# Patient Record
Sex: Male | Born: 1975 | Race: White | Hispanic: No | Marital: Married
Health system: Midwestern US, Academic
[De-identification: ages and names within clinical notes are randomized; demographics above are authoritative.]

---

## 2016-04-16 MED ORDER — EVEROLIMUS (IMMUNOSUPPRESSIVE) 0.5 MG PO TAB
2 mg | ORAL_TABLET | Freq: Two times a day (BID) | ORAL | 5 refills | 30.00000 days | Status: DC
Start: 2016-04-16 — End: 2017-02-06

## 2016-06-01 MED ORDER — PRAVASTATIN 10 MG PO TAB
10 mg | ORAL_TABLET | Freq: Every evening | ORAL | 1 refills | 90.00000 days | Status: DC
Start: 2016-06-01 — End: 2016-06-04

## 2016-06-01 MED ORDER — METOPROLOL TARTRATE 25 MG PO TAB
12.5 mg | ORAL_TABLET | Freq: Two times a day (BID) | ORAL | 1 refills | 90.00000 days | Status: DC
Start: 2016-06-01 — End: 2016-06-04

## 2016-06-04 MED ORDER — METOPROLOL TARTRATE 25 MG PO TAB
12.5 mg | ORAL_TABLET | Freq: Two times a day (BID) | ORAL | 1 refills | 90.00000 days | Status: DC
Start: 2016-06-04 — End: 2016-12-04

## 2016-06-04 MED ORDER — PRAVASTATIN 10 MG PO TAB
10 mg | ORAL_TABLET | Freq: Every evening | ORAL | 1 refills | 90.00000 days | Status: DC
Start: 2016-06-04 — End: 2016-12-04

## 2016-06-21 ENCOUNTER — Encounter: Admit: 2016-06-21 | Discharge: 2016-06-21 | Payer: MEDICARE

## 2016-06-21 ENCOUNTER — Ambulatory Visit: Admit: 2016-07-24 | Discharge: 2016-07-24 | Payer: MEDICARE

## 2016-06-21 DIAGNOSIS — C44622 Squamous cell carcinoma of skin of right upper limb, including shoulder: Principal | ICD-10-CM

## 2016-06-25 ENCOUNTER — Encounter: Admit: 2016-06-25 | Discharge: 2016-06-25 | Payer: MEDICARE

## 2016-06-25 DIAGNOSIS — K769 Liver disease, unspecified: ICD-10-CM

## 2016-06-25 DIAGNOSIS — C801 Malignant (primary) neoplasm, unspecified: ICD-10-CM

## 2016-06-25 DIAGNOSIS — K7460 Unspecified cirrhosis of liver: ICD-10-CM

## 2016-06-25 DIAGNOSIS — IMO0002 Squamous cell carcinoma: ICD-10-CM

## 2016-06-25 DIAGNOSIS — Z944 Liver transplant status: ICD-10-CM

## 2016-06-25 DIAGNOSIS — N200 Calculus of kidney: ICD-10-CM

## 2016-06-25 DIAGNOSIS — K754 Autoimmune hepatitis: Principal | ICD-10-CM

## 2016-06-25 DIAGNOSIS — A0472 Enterocolitis due to Clostridium difficile, not specified as recurrent: ICD-10-CM

## 2016-06-25 DIAGNOSIS — T148XXA Other injury of unspecified body region, initial encounter: ICD-10-CM

## 2016-06-25 DIAGNOSIS — I8500 Esophageal varices without bleeding: ICD-10-CM

## 2016-06-25 NOTE — Progress Notes
Name: Ricky Rhodes          MRN: [id number]      DOB: 09/03/1975      AGE: 41 y.o.   DATE OF SERVICE: 06/21/2016    Subjective:             Reason for Visit:  Heme/Onc Care      Ricky Rhodes is a 41 y.o. male.     Cancer Staging  No matching staging information was found for the patient.    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       Review of Systems   Constitutional: Negative.          Objective:         ??? aspirin EC 81 mg tablet Take 81 mg by mouth daily. Take with food.   ??? calcium carbonate-vitamin D3 600 mg(1,500mg ) -800 unit tab Take 1 Tab by mouth twice daily.   ??? Cholecalciferol (Vitamin D3) 2,000 unit cap Take 2,000 Units by mouth twice daily.   ??? everolimus (immunosuppressive) (ZORTRESS) 0.5 mg tablet Take 4 tablets by mouth twice daily.   ??? fluorouracil (EFUDEX) 5 % topical cream    ??? metoprolol tartrate (LOPRESSOR) 25 mg tablet Take 0.5 tablets by mouth twice daily.   ??? omeprazole DR(+) (PRILOSEC) 20 mg capsule Take 1 capsule by mouth twice daily.   ??? oxyCODONE (ROXICODONE, OXY-IR) 5 mg tablet Take 1-2 tablets by mouth every 4 hours as needed for Pain Earliest Fill Date: 02/28/16   ??? pravastatin (PRAVACHOL) 10 mg tablet Take 1 tablet by mouth at bedtime daily.   ??? prednisone (DELTASONE) 5 mg tablet Take 5 mg by mouth daily with breakfast.   ??? senna/docusate (SENOKOT-S) 8.6/50 mg tablet Take 1 tablet by mouth daily.   ??? tacrolimus (PROGRAF) 1 mg capsule Take 1 mg by mouth twice daily.     Vitals:    06/21/16 0853   BP: (!) 134/94   Pulse: 58   Resp: 12   Temp: 36.3 ???C (97.4 ???F)   TempSrc: Oral   SpO2: 99%   Weight: 90.5 kg (199 lb 9.6 oz)     Body mass index is 33.22 kg/m???.     Pain Score: Zero         Pain Addressed:  N/A    Patient Evaluated for a Clinical Trial: No treatment clinical trial available for this patient.     [place] Cooperative Oncology Group performance status is 0, Fully active, able to carry on all pre-disease performance without restriction.Marland Kitchen     Physical Exam   Constitutional: He appears well-developed and well-nourished.   Cardiovascular: Normal rate and regular rhythm.    No murmur heard.  Pulmonary/Chest: Effort normal and breath sounds normal. No respiratory distress.   Skin:   Biopsy site on base of the right thumb and then more medially on dorsal aspect of hand   Psychiatric: He has a normal mood and affect. His behavior is normal. Judgment and thought content normal.             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  Risks, benefits,  and alternatives were discussed with the patient and the patient is eager to proceed.

## 2016-07-03 ENCOUNTER — Encounter: Admit: 2016-07-03 | Discharge: 2016-07-03 | Payer: MEDICARE

## 2016-07-03 DIAGNOSIS — N200 Calculus of kidney: ICD-10-CM

## 2016-07-03 DIAGNOSIS — IMO0002 Squamous cell carcinoma: ICD-10-CM

## 2016-07-03 DIAGNOSIS — C801 Malignant (primary) neoplasm, unspecified: ICD-10-CM

## 2016-07-03 DIAGNOSIS — T148XXA Other injury of unspecified body region, initial encounter: ICD-10-CM

## 2016-07-03 DIAGNOSIS — I8500 Esophageal varices without bleeding: ICD-10-CM

## 2016-07-03 DIAGNOSIS — A0472 Enterocolitis due to Clostridium difficile, not specified as recurrent: ICD-10-CM

## 2016-07-03 DIAGNOSIS — K769 Liver disease, unspecified: ICD-10-CM

## 2016-07-03 DIAGNOSIS — K754 Autoimmune hepatitis: Principal | ICD-10-CM

## 2016-07-03 DIAGNOSIS — K7460 Unspecified cirrhosis of liver: ICD-10-CM

## 2016-07-03 DIAGNOSIS — Z944 Liver transplant status: ICD-10-CM

## 2016-07-03 NOTE — Pre-Anesthesia Patient Instructions
GENERAL INFORMATION    Before you come to the hospital  ??? Make arrangements for a responsible adult to drive you home and stay with you for 24 hours following surgery.  ??? Bath/Shower Instructions  ??? Take a bath or shower with antibacterial soap the night before or the morning of your procedure. Use clean towels.  ??? Put on clean clothes after bath or shower.  Avoid using lotion and oils.  ??? If you are having surgery above the waist, wear a shirt that fastens up the front.  ??? Sleep on clean sheets if bath or shower is done the night before procedure.  ??? Leave money, credit cards, jewelry, and any other valuables at home. [facility] is not responsible for the loss or breakage of personal items.  ??? Remove nail polish, makeup and all jewelry (including piercings) before coming to the hospital.  ??? The morning of your procedure:  ??? brush your teeth and tongue  ??? do not smoke  ??? do not shave the area where you will have surgery    What to bring to the hospital  ??? ID/ Insurance Card  ??? Medical Device card  ??? Official documents for legal guardianship   ??? Copy of your Living Will, Advanced Directives, and/or Durable Power of Attorney   ??? Small bag with a few personal belongings  ??? Walker,cane, or motorized scooter  ??? Cases for glasses/hearing aids/contact lens (bring solutions for contacts)  ??? Dress in clean, loose, comfortable clothing     Eating or drinking before surgery  ??? Do not eat or drink anything after 11:00 p.m. the day before your procedure (including gum, mints, candy, or chewing tobacco) OR follow the specific instructions you were given by your Surgeon.  ??? You may have WATER ONLY up to 2 hours before arriving at the hospital.  ??? Other instructions:      Other instructions  Notify your surgeon if:    ??? you become ill with a cough, fever, sore throat, nausea, vomiting or flu-like symptoms  ??? you have any open wounds/sores that are red, painful, draining, or are new since you last saw  the doctor  ??? you need to cancel your procedure  ??? You will receive a call with your surgery arrival time from between 2:30pm and 4:30pm the last business day before your procedure.  If you do not receive a call, please call [phone] before 4:30pm or [phone] after 4:30pm.    Notify us at [facility]: [phone]  ??? if you need to cancel your procedure  ??? if you are going to be late    Arrival at the hospital  [facility]  [address]  [place], [place] [address]    ? Park in the P5 parking garage located at [employer], [place], [place] [address].   ? Judee Clara parking is available in front of [employer] [facility] between the hours of 7:00 am and 4:00 pm Monday through Friday.  ? If parking in the P5 garage, take the east elevators in the parking garage to the second level and walk to the entrance of the [employer] [facility].    ? Enter through the 1st floor main entrance and check in with Information Desk.

## 2016-07-03 NOTE — Pre-Anesthesia Medication Instructions
YOUR MEDICATIONS:     aspirin EC 81 mg tablet Take 81 mg by mouth daily. Take with food.    calcium carbonate-vitamin D3 600 mg(1,500mg ) -800 unit tab Take 1 Tab by mouth twice daily.    Cholecalciferol (Vitamin D3) 2,000 unit cap Take 2,000 Units by mouth twice daily.    everolimus (immunosuppressive) (ZORTRESS) 0.5 mg tablet Take 4 tablets by mouth twice daily.    metoprolol tartrate (LOPRESSOR) 25 mg tablet Take 0.5 tablets by mouth twice daily.    omeprazole DR(+) (PRILOSEC) 20 mg capsule Take 1 capsule by mouth twice daily.    pravastatin (PRAVACHOL) 10 mg tablet Take 1 tablet by mouth at bedtime daily.    prednisone (DELTASONE) 5 mg tablet Take 5 mg by mouth daily with breakfast.    tacrolimus (PROGRAF) 1 mg capsule Take 1 mg by mouth twice daily.          YOUR  MEDICATION INSTRUCTIONS FOR SURGERY:    Before surgery  Stop the following vitamins, herbals, and natural supplements 14 days before surgery:      Stop the following medications 7 days before surgery:   Anti-inflammatory medications such as ibuprofen (Advil, Motrin) and naproxen (Aleve)   You may use acetaminophen (Tylenol)     Please follow these instructions regarding your blood thinner medications:     Stop Aspirin 7 days prior to your procedure      Morning of surgery  On the morning of surgery, do NOT take these medications:   Remaining vitamins/supplements   Ointments/creams/lotions   Vitamin D3    On the morning of surgery, take ONLY these medications with a sip (1-2 ounces) of water:   Prednisone   Metoprolol   Omeprazole   Tacrolimus      Other information  Before surgery, please contact Jonelle Sidle, with any medicine updates or questions.   E-mail:  [email or web address]   Phone:  [phone]    Before going home from the hospital, please ask your doctor when you should re-start your medicines that were stopped before surgery.

## 2016-07-03 NOTE — Progress Notes
[facility] Phone Triage Note:    [facility] Inpatient phone triage completed for procedure on 07/25/16. Patient denies cardiac, pulmonary or neurologic history. He denies chest pain, palpitations or SOA.  He states he could easily climb 2 flights of steps and remain asymptomatic. 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 The patient was advised to stop vitamins herbals and supplements 14 days prior to his procedure and avoid NSAIDS 7 days prior. He was instructed to take only approved medications on the DOS: Tacrolimus, Metoprolol, Prednisone and Omeprazole with sips of water only and may have continued sips of water until 2 hours prior to check in at Admissions. Pre op instructions completed including NPO after 11 PM. Patient verbalized understanding of all instructions and will receive a copy via mail with a map. No further questions at this time.

## 2016-07-10 ENCOUNTER — Encounter: Admit: 2016-07-10 | Discharge: 2016-07-10 | Payer: MEDICARE

## 2016-07-10 ENCOUNTER — Ambulatory Visit: Admit: 2016-07-10 | Discharge: 2016-07-10 | Payer: MEDICARE

## 2016-07-10 DIAGNOSIS — K7469 Other cirrhosis of liver: Principal | ICD-10-CM

## 2016-07-10 DIAGNOSIS — Z79899 Other long term (current) drug therapy: ICD-10-CM

## 2016-07-10 DIAGNOSIS — Z944 Liver transplant status: Secondary | ICD-10-CM

## 2016-07-10 DIAGNOSIS — A0472 Enterocolitis due to Clostridium difficile, not specified as recurrent: ICD-10-CM

## 2016-07-10 DIAGNOSIS — R1319 Other dysphagia: ICD-10-CM

## 2016-07-10 DIAGNOSIS — IMO0002 Squamous cell carcinoma: ICD-10-CM

## 2016-07-10 DIAGNOSIS — M899 Disorder of bone, unspecified: ICD-10-CM

## 2016-07-10 DIAGNOSIS — M8580 Other specified disorders of bone density and structure, unspecified site: ICD-10-CM

## 2016-07-10 DIAGNOSIS — K7460 Unspecified cirrhosis of liver: ICD-10-CM

## 2016-07-10 DIAGNOSIS — C801 Malignant (primary) neoplasm, unspecified: ICD-10-CM

## 2016-07-10 DIAGNOSIS — I8500 Esophageal varices without bleeding: ICD-10-CM

## 2016-07-10 DIAGNOSIS — N200 Calculus of kidney: ICD-10-CM

## 2016-07-10 DIAGNOSIS — Z4889 Encounter for other specified surgical aftercare: ICD-10-CM

## 2016-07-10 DIAGNOSIS — K754 Autoimmune hepatitis: Principal | ICD-10-CM

## 2016-07-10 DIAGNOSIS — K769 Liver disease, unspecified: ICD-10-CM

## 2016-07-10 DIAGNOSIS — T148XXA Other injury of unspecified body region, initial encounter: ICD-10-CM

## 2016-07-10 NOTE — Progress Notes
Date of Service: 07/10/2016    Ricky Rhodes is a 41 y.o. male.    Subjective:             Chief Complaint/Purpose of Visit: routine follow up, status post OLT     History of Present Illness     Ricky Rhodes is a pleasant 41 year old male who is followed in our clinic for post transplant management.  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 He did have mild rejection shortly after surgery but no issues with graft function or rejection since.     Ricky Rhodes has however had issues with recurrent invasive squamous cell skin cancers.  He follows with his dermatologist regularly. Once these cancers started developing, Dr. Levada Dy transitioned Mr. Remo' immunosuppression to an Everolimus based regimen. Overall, the development and severity of new lesions decreased.  Aside from this, he has experienced a significant improvement in his overall health since transplant.  He denies any physical complaints today and feels well.      [name] did undergo orthopedic surgery last year for a tibial fracture.  Everolimus was held during this time, as it can affect wound healing, and Tacrolimus used as primary IS agent.  Mr. Hackbart recovered from surgery without issue and has since re-started Everolimus.   ???  Ricky Rhodes is accompanied today by his wife, Jolene.  She is very knowledgeable regarding his medication regimen and plan of care. They live together in a town about 2 hours away with their three children.     Ricky Rhodes endorses no other worrisome health concerns at today's visit.       Review of Systems: A 10 point review systems is performed and with the exceptions noted in the HPI are otherwise unremarkable.     Objective:         ??? aspirin EC 81 mg tablet Take 81 mg by mouth daily. Take with food.   ??? calcium carbonate-vitamin D3 600 mg(1,500mg ) -800 unit tab Take 1 Tab by mouth twice daily.   ??? Cholecalciferol (Vitamin D3) 2,000 unit cap Take 2,000 Units by mouth twice daily. ??? everolimus (immunosuppressive) (ZORTRESS) 0.5 mg tablet Take 4 tablets by mouth twice daily.   ??? metoprolol tartrate (LOPRESSOR) 25 mg tablet Take 0.5 tablets by mouth twice daily.   ??? omeprazole DR(+) (PRILOSEC) 20 mg capsule Take 1 capsule by mouth twice daily.   ??? pravastatin (PRAVACHOL) 10 mg tablet Take 1 tablet by mouth at bedtime daily.   ??? prednisone (DELTASONE) 5 mg tablet Take 5 mg by mouth daily with breakfast.   ??? tacrolimus (PROGRAF) 1 mg capsule Take 1 mg by mouth twice daily.     Vitals:    07/10/16 1005   BP: (!) 132/98   Pulse: 51   Resp: 16   Temp: 36.7 ???C (98 ???F)   TempSrc: Oral   SpO2: 98%   Weight: 88.9 kg (196 lb)   Height: 167.6 cm (66)     Body mass index is 31.64 kg/m???.    Physical Exam   Constitutional: He appears well-developed and well-nourished. No distress.   Eyes: No scleral icterus.   Cardiovascular: Normal rate, regular rhythm and normal heart sounds.    No murmur heard.  Pulmonary/Chest: Effort normal and breath sounds normal.   Abdominal: Soft. He exhibits no distension. There is no tenderness.   Musculoskeletal: Normal range of motion. He exhibits no edema.   Neurological: He  is alert and oriented to person, place, and time.   No tremor   Skin: Skin is warm and dry. Multiple dry flaky lesions noted on bilateral upper extremities.        Assessment and Plan:  1. 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  [name] did have labs completed last week, and we will request these results for review. There have been no recent issues with rejection. Continue with labs on an every 1-2 month basis.   [name] had an ultrasound performed prior to today's visit that demonstrated no worrisome findings.   ???  2. Chronic immunosuppression management: I will follow up on drug trough levels from labs obtained last week. Overall, patient is tolerating current IS regimen without issue. I recommend no changes today.   Everolimus: Trough goal 5-8, current dose 2 mg BID  Tacrolimus: Trough goal 2-3, current dose 1 mg BID  Prednisone: 5 mg daily  ???  3. Healthcare Maintenance:   [name] is at increased risk for hyperlipidemia with use of Everolimus.  Lipid panel from 02/2016 reviewed with patient and is much improved from previous testing. Continue Pravastain 10 mg qhs. I do recommend that we re-check a lipid panel later this year.     DEXA scan last performed 02/2014 and was suggestive of osteopenia. Serum Vitamin D was 57 in February. I do recommend that we obtain a repeat DEXA this year--- will have orders faxed to [facility].           At the end of the visit, Ricky Rhodes had all of his questions answered. He has our contact information and is encouraged to call with any new questions or concerns.  I would like to see the patient back for follow up in 1 year with Ricky Rhodes with an ultrasound on the same day.

## 2016-07-12 ENCOUNTER — Encounter: Admit: 2016-07-12 | Discharge: 2016-07-12 | Payer: MEDICARE

## 2016-07-12 DIAGNOSIS — Z944 Liver transplant status: Principal | ICD-10-CM

## 2016-07-12 DIAGNOSIS — Z79899 Other long term (current) drug therapy: ICD-10-CM

## 2016-07-12 NOTE — Telephone Encounter
[facility] lab to discuss pt's standing order. On 07/05/16, pt's lab draw included sirolimus level and not everolimus level.  Per lab, pt's standing order is for tacrolimus and everolimus and sirolimus was processed in error.  NC asked lab to discard all standing orders for pt and will fax new order to avoid confusion.  NC will also fax one-time order for tacrolimus and everolimus levels to be drawn within the next week.  Pt notified via [place].

## 2016-07-17 ENCOUNTER — Encounter: Admit: 2016-07-17 | Discharge: 2016-07-17 | Payer: MEDICARE

## 2016-07-17 DIAGNOSIS — Z79899 Other long term (current) drug therapy: ICD-10-CM

## 2016-07-17 DIAGNOSIS — Z944 Liver transplant status: Principal | ICD-10-CM

## 2016-07-17 LAB — COMPREHENSIVE METABOLIC PANEL
Lab: 0.6 mg/dL — ABNORMAL HIGH (ref 0.20–1.30)
Lab: 1 mg/dL — ABNORMAL HIGH (ref 0.7–1.2)
Lab: 144 mmol/l — ABNORMAL LOW (ref 134.0–144.0)
Lab: 17 mg/dL (ref >60)
Lab: 40 U/L (ref >60)
Lab: 42 U/L — ABNORMAL HIGH (ref 21–72)
Lab: 72 mg/dL — ABNORMAL LOW (ref 74–106)
Lab: 89 U/L — ABNORMAL HIGH (ref 34–104)
Lab: >60 mL/min (ref 14.0–18.0)

## 2016-07-17 LAB — CBC AND DIFF
Lab: 0.1 10*3/uL (ref 0.0–0.1)
Lab: 0.3 10*3/uL — ABNORMAL HIGH (ref 0.0–0.2)
Lab: 1 10*3/uL — ABNORMAL HIGH (ref 0.3–0.8)
Lab: 1.9 10*3/uL (ref 1.3–2.9)
Lab: 3.8 10*3/uL (ref 2.2–4.8)

## 2016-07-20 ENCOUNTER — Encounter: Admit: 2016-07-20 | Discharge: 2016-07-20 | Payer: MEDICARE

## 2016-07-23 ENCOUNTER — Encounter: Admit: 2016-07-23 | Discharge: 2016-07-23 | Payer: MEDICARE

## 2016-07-23 DIAGNOSIS — Z79899 Other long term (current) drug therapy: ICD-10-CM

## 2016-07-23 DIAGNOSIS — Z944 Liver transplant status: Principal | ICD-10-CM

## 2016-07-23 LAB — EVEROLIMUS BLOOD

## 2016-07-23 LAB — GGTP: Lab: 33 U/L (ref 3–95)

## 2016-07-23 LAB — TACROLIMUS IMMUNOASSAY (FK506): Lab: 2 ug/L — ABNORMAL LOW (ref 5.0–20.0)

## 2016-07-24 DIAGNOSIS — L570 Actinic keratosis: Principal | ICD-10-CM

## 2016-07-25 ENCOUNTER — Encounter: Admit: 2016-07-25 | Discharge: 2016-07-25 | Payer: MEDICARE

## 2016-08-03 ENCOUNTER — Encounter: Admit: 2016-08-03 | Discharge: 2016-08-03 | Payer: MEDICARE

## 2016-08-08 ENCOUNTER — Encounter: Admit: 2016-08-08 | Discharge: 2016-08-08 | Payer: MEDICARE

## 2016-08-10 ENCOUNTER — Encounter: Admit: 2016-08-10 | Discharge: 2016-08-10 | Payer: MEDICARE

## 2016-08-10 NOTE — Telephone Encounter
Called pt to request repeat labs.  No answer, left voicemail.

## 2016-08-17 ENCOUNTER — Encounter: Admit: 2016-08-17 | Discharge: 2016-08-17 | Payer: MEDICARE

## 2016-08-20 ENCOUNTER — Encounter: Admit: 2016-08-20 | Discharge: 2016-08-20 | Payer: MEDICARE

## 2016-08-20 DIAGNOSIS — Z79899 Other long term (current) drug therapy: ICD-10-CM

## 2016-08-20 DIAGNOSIS — Z944 Liver transplant status: Principal | ICD-10-CM

## 2016-08-20 LAB — TACROLIMUS IMMUNOASSAY (FK506): Lab: 1.9 ug/L — ABNORMAL LOW (ref 5.0–20.0)

## 2016-08-20 LAB — EVEROLIMUS BLOOD: Lab: 6.7 ng/mL

## 2016-08-20 LAB — GGTP: Lab: 44 U/L (ref 3–95)

## 2016-08-23 ENCOUNTER — Encounter: Admit: 2016-08-23 | Discharge: 2016-08-23 | Payer: MEDICARE

## 2016-08-23 DIAGNOSIS — Z79899 Other long term (current) drug therapy: ICD-10-CM

## 2016-08-23 DIAGNOSIS — Z944 Liver transplant status: Principal | ICD-10-CM

## 2016-08-23 LAB — COMPREHENSIVE METABOLIC PANEL
Lab: 0.4 mg/dL (ref 0.20–1.30)
Lab: 1 mg/dL — ABNORMAL LOW (ref 0.7–1.2)
Lab: 106 mmol/l — ABNORMAL HIGH (ref 98.0–107.0)
Lab: 145 mmol/l — ABNORMAL HIGH (ref 134.0–144.0)
Lab: 18 mg/dL (ref 9–20)
Lab: 26 mmol/l — ABNORMAL HIGH (ref 22–30)
Lab: 3.9 mmol/L (ref 3.5–5.1)
Lab: 35 U/L — ABNORMAL HIGH (ref 17–59)
Lab: 4 g/dL — ABNORMAL LOW (ref >=60)
Lab: 45 U/L (ref 21–72)
Lab: 6 mL/min — ABNORMAL LOW (ref 90.0–130.0)
Lab: 7.3 g/dL — ABNORMAL HIGH (ref 6.3–8.2)
Lab: 77 U/L — ABNORMAL LOW (ref 34–104)
Lab: 84 mg/dL — ABNORMAL LOW (ref 74–106)
Lab: 9.2 mg/dL — ABNORMAL LOW (ref >=60)

## 2016-08-23 LAB — CBC AND DIFF
Lab: 0.1 10*3/uL — ABNORMAL LOW (ref 0–0.1)
Lab: 0.3 10*3/uL — ABNORMAL HIGH (ref 0–0.2)
Lab: 1.9 10*3/uL (ref 1.3–2.9)
Lab: 8.1 10*3/uL (ref 4.8–10.8)

## 2016-08-27 ENCOUNTER — Encounter: Admit: 2016-08-27 | Discharge: 2016-08-27 | Payer: MEDICARE

## 2016-09-03 ENCOUNTER — Encounter: Admit: 2016-09-03 | Discharge: 2016-09-03 | Payer: MEDICARE

## 2016-09-03 ENCOUNTER — Ambulatory Visit: Admit: 2016-07-25 | Discharge: 2016-07-25 | Payer: MEDICARE

## 2016-09-03 ENCOUNTER — Ambulatory Visit: Admit: 2016-09-03 | Discharge: 2016-09-03 | Payer: MEDICARE

## 2016-09-03 DIAGNOSIS — Z85820 Personal history of malignant melanoma of skin: ICD-10-CM

## 2016-09-03 DIAGNOSIS — IMO0002 Squamous cell carcinoma: ICD-10-CM

## 2016-09-03 DIAGNOSIS — K769 Liver disease, unspecified: ICD-10-CM

## 2016-09-03 DIAGNOSIS — Z7982 Long term (current) use of aspirin: ICD-10-CM

## 2016-09-03 DIAGNOSIS — Z944 Liver transplant status: ICD-10-CM

## 2016-09-03 DIAGNOSIS — C801 Malignant (primary) neoplasm, unspecified: ICD-10-CM

## 2016-09-03 DIAGNOSIS — L570 Actinic keratosis: Principal | ICD-10-CM

## 2016-09-03 DIAGNOSIS — I8500 Esophageal varices without bleeding: ICD-10-CM

## 2016-09-03 DIAGNOSIS — T148XXA Other injury of unspecified body region, initial encounter: ICD-10-CM

## 2016-09-03 DIAGNOSIS — K7460 Unspecified cirrhosis of liver: ICD-10-CM

## 2016-09-03 DIAGNOSIS — Z85828 Personal history of other malignant neoplasm of skin: ICD-10-CM

## 2016-09-03 DIAGNOSIS — L905 Scar conditions and fibrosis of skin: ICD-10-CM

## 2016-09-03 DIAGNOSIS — A0472 Enterocolitis due to Clostridium difficile, not specified as recurrent: ICD-10-CM

## 2016-09-03 DIAGNOSIS — K754 Autoimmune hepatitis: Principal | ICD-10-CM

## 2016-09-03 DIAGNOSIS — N200 Calculus of kidney: ICD-10-CM

## 2016-09-03 MED ORDER — PROMETHAZINE 25 MG/ML IJ SOLN
6.25 mg | INTRAVENOUS | 0 refills | Status: DC | PRN
Start: 2016-09-03 — End: 2016-09-03

## 2016-09-03 MED ORDER — LACTATED RINGERS IV SOLP
INTRAVENOUS | 0 refills | Status: DC
Start: 2016-09-03 — End: 2016-09-03
  Administered 2016-09-03: 12:00:00 1000.000 mL via INTRAVENOUS

## 2016-09-03 MED ORDER — LACTATED RINGERS IV SOLP
INTRAVENOUS | 0 refills | Status: DC
Start: 2016-09-03 — End: 2016-09-03

## 2016-09-03 MED ORDER — PREDNISONE 5 MG PO TAB
5 mg | ORAL_TABLET | Freq: Every day | ORAL | 1 refills | Status: AC
Start: 2016-09-03 — End: 2016-12-04

## 2016-09-03 MED ORDER — FENTANYL CITRATE (PF) 50 MCG/ML IJ SOLN
0 refills | Status: DC
Start: 2016-09-03 — End: 2016-09-03
  Administered 2016-09-03: 14:00:00 25 ug via INTRAVENOUS
  Administered 2016-09-03: 13:00:00 50 ug via INTRAVENOUS
  Administered 2016-09-03: 13:00:00 25 ug via INTRAVENOUS

## 2016-09-03 MED ORDER — OXYCODONE 5 MG PO TAB
5 mg | ORAL_TABLET | ORAL | 0 refills | 6.00000 days | Status: AC | PRN
Start: 2016-09-03 — End: 2016-12-19

## 2016-09-03 MED ORDER — BACITRACIN ZINC 500 UNIT/GRAM TP OINT
0 refills | Status: DC
Start: 2016-09-03 — End: 2016-09-03
  Administered 2016-09-03: 14:00:00 1 via TOPICAL

## 2016-09-03 MED ORDER — DEXAMETHASONE SODIUM PHOSPHATE 4 MG/ML IJ SOLN
INTRAVENOUS | 0 refills | Status: DC
Start: 2016-09-03 — End: 2016-09-03
  Administered 2016-09-03: 13:00:00 4 mg via INTRAVENOUS

## 2016-09-03 MED ORDER — CEFAZOLIN 1 GRAM IJ SOLR
0 refills | Status: DC
Start: 2016-09-03 — End: 2016-09-03
  Administered 2016-09-03: 13:00:00 2 g via INTRAVENOUS

## 2016-09-03 MED ORDER — LIDOCAINE (PF) 200 MG/10 ML (2 %) IJ SYRG
0 refills | Status: DC
Start: 2016-09-03 — End: 2016-09-03
  Administered 2016-09-03: 13:00:00 80 mg via INTRAVENOUS

## 2016-09-03 MED ORDER — LIDOCAINE (PF) 10 MG/ML (1 %) IJ SOLN
.1-2 mL | INTRAMUSCULAR | 0 refills | Status: DC | PRN
Start: 2016-09-03 — End: 2016-09-03

## 2016-09-03 MED ORDER — ACETAMINOPHEN 650 MG PO TBER
650 mg | ORAL_TABLET | ORAL | 1 refills | Status: AC | PRN
Start: 2016-09-03 — End: ?

## 2016-09-03 MED ORDER — DEXTRAN 70-HYPROMELLOSE (PF) 0.1-0.3 % OP DPET
0 refills | Status: DC
Start: 2016-09-03 — End: 2016-09-03
  Administered 2016-09-03: 13:00:00 2 [drp] via OPHTHALMIC

## 2016-09-03 MED ORDER — OXYCODONE 5 MG PO TAB
5-10 mg | Freq: Once | ORAL | 0 refills | Status: CP | PRN
Start: 2016-09-03 — End: ?
  Administered 2016-09-03: 15:00:00 10 mg via ORAL

## 2016-09-03 MED ORDER — DIPHENHYDRAMINE HCL 50 MG/ML IJ SOLN
25 mg | Freq: Once | INTRAVENOUS | 0 refills | Status: DC | PRN
Start: 2016-09-03 — End: 2016-09-03

## 2016-09-03 MED ORDER — SENNOSIDES-DOCUSATE SODIUM 8.6-50 MG PO TAB
1 | ORAL_TABLET | Freq: Every day | ORAL | 1 refills | Status: AC
Start: 2016-09-03 — End: 2016-12-19

## 2016-09-03 MED ORDER — PROPOFOL INJ 10 MG/ML IV VIAL
0 refills | Status: DC
Start: 2016-09-03 — End: 2016-09-03
  Administered 2016-09-03: 13:00:00 200 mg via INTRAVENOUS

## 2016-09-03 MED ORDER — ONDANSETRON HCL (PF) 4 MG/2 ML IJ SOLN
INTRAVENOUS | 0 refills | Status: DC
Start: 2016-09-03 — End: 2016-09-03
  Administered 2016-09-03: 13:00:00 4 mg via INTRAVENOUS

## 2016-09-03 MED ORDER — HYDROMORPHONE (PF) 2 MG/ML IJ SYRG
.5-1 mg | INTRAVENOUS | 0 refills | Status: DC | PRN
Start: 2016-09-03 — End: 2016-09-03

## 2016-09-03 MED ORDER — FENTANYL CITRATE (PF) 50 MCG/ML IJ SOLN
25-50 ug | INTRAVENOUS | 0 refills | Status: DC | PRN
Start: 2016-09-03 — End: 2016-09-03

## 2016-09-03 MED ORDER — HALOPERIDOL LACTATE 5 MG/ML IJ SOLN
1 mg | Freq: Once | INTRAVENOUS | 0 refills | Status: DC | PRN
Start: 2016-09-03 — End: 2016-09-03

## 2016-09-03 MED ORDER — PHENYLEPHRINE IN 0.9% NACL(PF) 1 MG/10 ML (100 MCG/ML) IV SYRG
INTRAVENOUS | 0 refills | Status: DC
Start: 2016-09-03 — End: 2016-09-03
  Administered 2016-09-03: 14:00:00 100 ug via INTRAVENOUS

## 2016-09-03 NOTE — Other
Brief Operative Note    Name: Ricky Rhodes is a 41 y.o. male     DOB: 09-10-1975             MRN#: [id number]  DATE OF OPERATION: 09/03/2016    Date:  09/03/2016        Preoperative Dx:   SCC (squamous cell carcinoma), hand, right [C44.622]    Post-op Diagnosis      * SCC (squamous cell carcinoma), hand, right [C44.622]    Procedure(s) (LRB):  WIDE LOCAL EXCISION OF RIGHT HAND x 2 (Right)    Anesthesia Type: General    Surgeon(s) and Role:     Nelda Marseille, MD - Resident - Assisting     * Cathlean Marseilles, MD - Primary      Findings:  2.1cm diameter base of right thumb 2cm diameter for more medial wide local excision    Estimated Blood Loss: No blood loss documented.     Specimen(s) Removed/Disposition:   ID Type Source Tests Collected by Time Destination   1 : Wide local excisoin base of right thumb Tissue Arm SURGICAL PATHOLOGY          Cathlean Marseilles, MD 09/03/2016 3875    2 : Wide local excision base of right thumb, additional medial margins Tissue Arm SURGICAL PATHOLOGY          Cathlean Marseilles, MD 09/03/2016 6433    3 : Wide local excision base of right thumb, additional lateral margins.   Tissue Arm SURGICAL PATHOLOGY          Cathlean Marseilles, MD 09/03/2016 [phone]    4 : Right hand more medial wide local excision Tissue Arm SURGICAL PATHOLOGY          Cathlean Marseilles, MD 09/03/2016 [phone]    5 : Right hand more medial wide local excision additional medial margins Tissue Arm SURGICAL PATHOLOGY          Cathlean Marseilles, MD 09/03/2016 [phone]    6 : Right hand more medial wide local excision additional lateral margins Tissue Arm SURGICAL PATHOLOGY          Cathlean Marseilles, MD 09/03/2016 3016        Complications:  None    Implants: None    Drains: None    Disposition:  PACU - stable    Cathlean Marseilles, MD  Pager [phone]

## 2016-09-03 NOTE — H&P (View-Only)
Admission History and Physical Examination      Name:  Ricky Rhodes                                             MRN:  [id number]   Admission Date:  09/03/2016                     Assessment/Plan:    Principal Problem:    SCC (squamous cell carcinoma), hand, right    Wide local excision of right hand  __________________________________________________________________________________  Primary Care Physician: Bayard Beaver  Verified    Chief Complaint:  Squamous cell cancer of right hand  History of Present Illness: Ricky Rhodes is a 41 y.o. 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  Past Medical History:   Diagnosis Date   ??? Autoimmune hepatitis (HCC)    ??? C. difficile diarrhea April 2014   ??? Cancer [facility])    ??? Cirrhosis of liver without mention of alcohol    ??? Esophageal varices (HCC)    ??? Fracture    ??? Liver disease, chronic    ??? Liver replaced by transplant [facility]) June 2014   ??? Nephrolithiasis    ??? Squamous cell carcinoma     bilateral hands     Past Surgical History:   Procedure Laterality Date   ??? HERNIA REPAIR  01/2012   ??? LIVER TRANSPLANT  06/2012   ??? SKIN CANCER EXCISION Bilateral 12/01/2013    wide local excision of arms with sentinel lymph node biopsy   ??? PR EXCISION MALIGNANT LESION TRUNK/ARM/LEG > 4.0 CM Left 02/18/2015    WIDE LOCAL EXCISION OF LEFT SECOND FINGER, and left forearm excision of lesion performed by Lorelei Pont, MD at [facility]   ??? PR APPLICATION MULTIPLANE EXTERNAL FIXATION SYSTEM Left 07/30/2015    LEFT ANKLE EXTERNAL FIXATION APPLICATION  performed by Windell Norfolk, MD at [facility]   ??? PR OPEN TREATMENT FRACTURE DISTAL TIBIA ONLY Left 08/19/2015    OPEN REDUCTION INTERNAL FIXATION LEFT PILON performed by Windell Norfolk, MD at [facility] ??? PR REMOVAL EXTERNAL FIXATION SYSTEM UNDER ANES Left 08/19/2015    EXTERNAL FIXATION REMOVAL LEFT ANKLE performed by Windell Norfolk, MD at [facility]   ??? SKIN CANCER EXCISION Bilateral 02/28/2016    WIDE LOCAL EXCISION OF RIGHT HAND, LOCAL EXCISION OF LEFT FOREARM performed by Lorelei Pont, MD at [facility]   ??? FEMUR FRACTURE SURGERY     ??? UPPER GASTROINTESTINAL ENDOSCOPY      MAC's without complications     Family History   Problem Relation Age of Onset   ??? Hypertension Mother    ??? Arthritis-osteo Mother    ??? Heart Failure Sister    ??? Thyroid Disease Sister    ??? Cancer Maternal Grandmother    ??? Heart Failure Maternal Grandmother    ??? Arthritis-osteo Maternal Grandmother    ??? Heart Failure Maternal Grandfather      Social History     Social History   ??? Marital status: Married     Spouse name: N/A   ???  Number of children: N/A   ??? Years of education: N/A     Social History Main Topics   ??? Smoking status: Never Smoker   ??? Smokeless tobacco: Former Neurosurgeon     Types: Chew   ??? Alcohol use No   ??? Drug use: No   ??? Sexual activity: Not on file     Other Topics Concern   ??? Not on file     Social History Narrative   ??? No narrative on file      Immunizations (includes history and patient reported):   Immunization History   Administered Date(s) Administered   ??? Pneumococcal Vaccine (23-Val Adult) 05/01/2012           Allergies:  Valium [diazepam]    Medications:  Prescriptions Prior to Admission   Medication Sig   ??? aspirin EC 81 mg tablet Take 81 mg by mouth daily. Take with food.   ??? calcium carbonate-vitamin D3 600 mg(1,500mg ) -800 unit tab Take 1 Tab by mouth twice daily.   ??? Cholecalciferol (Vitamin D3) 2,000 unit cap Take 2,000 Units by mouth twice daily.   ??? everolimus (immunosuppressive) (ZORTRESS) 0.5 mg tablet Take 4 tablets by mouth twice daily.   ??? metoprolol tartrate (LOPRESSOR) 25 mg tablet Take 0.5 tablets by mouth twice daily.   ??? omeprazole DR(+) (PRILOSEC) 20 mg capsule Take 1 capsule by mouth twice daily.   ??? pravastatin (PRAVACHOL) 10 mg tablet Take 1 tablet by mouth at bedtime daily.   ??? prednisone (DELTASONE) 5 mg tablet Take 5 mg by mouth daily with breakfast.   ??? tacrolimus (PROGRAF) 1 mg capsule Take 1 mg by mouth twice daily.       ROS    Physical Exam   Constitutional: He appears well-developed and well-nourished.   Cardiovascular: Normal rate and regular rhythm.    Pulmonary/Chest: Effort normal. No respiratory distress.   Skin:   Right hand biopsy sites healing well   Psychiatric: He has a normal mood and affect. His behavior is normal. Judgment and thought content normal.     Vital Signs: Last Filed In 24 Hours Vital Signs: 24 Hour Range   BP: 126/99 (08/20 0713)  Temp: 36.9 ???C (98.4 ???F) (08/20 4782)  Pulse: 67 (08/20 0713)  Respirations: 19 PER MINUTE (08/20 0713)  SpO2: 95 % (08/20 0713)  O2 Delivery: None (Room Air) (08/20 0713)  Height: 167.6 cm (66) (08/20 0713) BP: (126)/(99)   Temp:  [36.9 ???C (98.4 ???F)]   Pulse:  [67]   Respirations:  [19 PER MINUTE]   SpO2:  [95 %]   O2 Delivery: None (Room Air)                Lab/Radiology/Other Diagnostic Tests:  24-hour labs:  No results found for this visit on 09/03/16 (from the past 24 hour(s)).     No pertinent radiology.    Lorelei Pont, MD  Pager [phone]

## 2016-09-03 NOTE — Anesthesia Post-Procedure Evaluation
Post-Anesthesia Evaluation    Name: Ricky Rhodes      MRN: [id number]     DOB: Apr 23, 1975     Age: 41 y.o.     Sex: male   __________________________________________________________________________     Procedure Date: 09/03/2016  Procedure: Procedure(s) with comments:  WIDE LOCAL EXCISION OF RIGHT HAND x 2 - CASE LENGTH 1.5 HOURS, REQUEST 0800 START      Surgeon: Surgeon(s):  Cathlean Marseilles, MD  Nelda Marseille, MD    Post-Anesthesia Vitals  BP: 120/87 (08/20 0930)  Temp: 36.5 C (97.7 F) (08/20 0914)  Pulse: 83 (08/20 0930)  Respirations: 16 PER MINUTE (08/20 0930)  SpO2: 96 % (08/20 0930)  O2 Delivery: None (Room Air) (08/20 0930)  SpO2 Pulse: 70 (08/20 0930)      Post Anesthesia Evaluation Note    Evaluation location: Pre/Post  Patient participation: recovered; patient participated in evaluation  Level of consciousness: alert    Pain score: Pain scale: adequate.  Pain management: adequate    Hydration: normovolemia  Temperature: 36.0C - 38.4C  Airway patency: adequate    Perioperative Events  Perioperative events:  no       Post-op nausea and vomiting: no PONV    Postoperative Status  Cardiovascular status: hemodynamically stable  Respiratory status: spontaneous ventilation  Follow-up needed: none        Perioperative Events  Perioperative Event: No  Emergency Case Activation: No

## 2016-09-05 ENCOUNTER — Encounter: Admit: 2016-09-05 | Discharge: 2016-09-05 | Payer: MEDICARE

## 2016-09-05 DIAGNOSIS — I8500 Esophageal varices without bleeding: ICD-10-CM

## 2016-09-05 DIAGNOSIS — N200 Calculus of kidney: ICD-10-CM

## 2016-09-05 DIAGNOSIS — K754 Autoimmune hepatitis: Principal | ICD-10-CM

## 2016-09-05 DIAGNOSIS — T148XXA Other injury of unspecified body region, initial encounter: ICD-10-CM

## 2016-09-05 DIAGNOSIS — K7460 Unspecified cirrhosis of liver: ICD-10-CM

## 2016-09-05 DIAGNOSIS — K769 Liver disease, unspecified: ICD-10-CM

## 2016-09-05 DIAGNOSIS — Z944 Liver transplant status: ICD-10-CM

## 2016-09-05 DIAGNOSIS — A0472 Enterocolitis due to Clostridium difficile, not specified as recurrent: ICD-10-CM

## 2016-09-05 DIAGNOSIS — C801 Malignant (primary) neoplasm, unspecified: ICD-10-CM

## 2016-09-05 DIAGNOSIS — IMO0002 Squamous cell carcinoma: ICD-10-CM

## 2016-09-05 NOTE — Telephone Encounter
Patient notified of recent surgical pathology results, verbalized understanding and will call with any other questions or concerns. Nurse confirmed post-op appt details with pt.

## 2016-09-15 ENCOUNTER — Inpatient Hospital Stay: Admit: 2016-09-15 | Discharge: 2016-09-16 | Disposition: A | Discharge status: Home / Self Care | Payer: MEDICARE

## 2016-09-15 ENCOUNTER — Encounter: Admit: 2016-09-15 | Discharge: 2016-09-15 | Payer: MEDICARE

## 2016-09-15 DIAGNOSIS — I8500 Esophageal varices without bleeding: ICD-10-CM

## 2016-09-15 DIAGNOSIS — K7460 Unspecified cirrhosis of liver: ICD-10-CM

## 2016-09-15 DIAGNOSIS — K754 Autoimmune hepatitis: Principal | ICD-10-CM

## 2016-09-15 DIAGNOSIS — Z944 Liver transplant status: ICD-10-CM

## 2016-09-15 DIAGNOSIS — C801 Malignant (primary) neoplasm, unspecified: ICD-10-CM

## 2016-09-15 DIAGNOSIS — T148XXA Other injury of unspecified body region, initial encounter: ICD-10-CM

## 2016-09-15 DIAGNOSIS — N200 Calculus of kidney: ICD-10-CM

## 2016-09-15 DIAGNOSIS — IMO0002 Squamous cell carcinoma: ICD-10-CM

## 2016-09-15 DIAGNOSIS — A0472 Enterocolitis due to Clostridium difficile, not specified as recurrent: ICD-10-CM

## 2016-09-15 DIAGNOSIS — K769 Liver disease, unspecified: ICD-10-CM

## 2016-09-15 MED ORDER — DOXYCYCLINE HYCLATE 100 MG PO TAB
100 mg | ORAL_TABLET | Freq: Two times a day (BID) | ORAL | 0 refills | 8.00000 days | Status: AC
Start: 2016-09-15 — End: 2017-07-16

## 2016-09-15 MED ORDER — DOXYCYCLINE HYCLATE 100 MG PO TAB
100 mg | Freq: Once | ORAL | 0 refills | Status: CP
Start: 2016-09-15 — End: ?
  Administered 2016-09-15: 100 mg via ORAL

## 2016-09-15 MED ORDER — EVEROLIMUS (IMMUNOSUPPRESSIVE) 0.5 MG PO TAB
2 mg | Freq: Once | ORAL | 0 refills | Status: CP
Start: 2016-09-15 — End: ?
  Administered 2016-09-16: 04:00:00 2 mg via ORAL

## 2016-09-15 MED ORDER — TACROLIMUS 1 MG PO CAP
1 mg | Freq: Once | ORAL | 0 refills | Status: CP
Start: 2016-09-15 — End: ?
  Administered 2016-09-16: 04:00:00 1 mg via ORAL

## 2016-09-15 MED ORDER — CLINDAMYCIN IN 5 % DEXTROSE 600 MG/50 ML IV PGBK
600 mg | Freq: Once | INTRAVENOUS | 0 refills | Status: CP
Start: 2016-09-15 — End: ?
  Administered 2016-09-15: 600 mg via INTRAVENOUS

## 2016-09-15 MED ORDER — OXYCODONE 5 MG PO TAB
5 mg | Freq: Once | ORAL | 0 refills | Status: CP
Start: 2016-09-15 — End: ?
  Administered 2016-09-15: 23:00:00 5 mg via ORAL

## 2016-09-15 NOTE — ED Notes
Mask placed on pt.

## 2016-09-15 NOTE — ED Notes
41yo M presents to ED37 via ambulation with c/o right and arm swelling upon wakening yesterday after a removal of skin CA 12 days ago on his right hand. Pt states that he called the MD that did the procedure and states that he told pt to come to the ED for an eval. Pt denies any other complaints at this time. Pt is AAO4, bed in lowest locked position, side rails up, call light in reach. Will continue to monitor pt.    All belongings gathered and placed in belonging bag with patient labels at bedside. The bag(s) contain(s) the following:    Clothing: shirt, pants, underwear, socks  Shoes: tennis shoes  Jewelry: ring x1  Identification/Drivers license: ID in pt wallet  Cash: under $20  Credit cards: 2-3 cards  Electronics: cell phone  Dentures/Glasses/Hearing aids: glasses    All belongings placed on pt currently    Belongings disposition: with patient at bedside

## 2016-09-16 ENCOUNTER — Encounter: Admit: 2016-09-16 | Discharge: 2016-09-16 | Payer: MEDICARE

## 2016-09-16 DIAGNOSIS — T814XXA Infection following a procedure, initial encounter: Principal | ICD-10-CM

## 2016-09-16 DIAGNOSIS — Z85828 Personal history of other malignant neoplasm of skin: ICD-10-CM

## 2016-09-16 DIAGNOSIS — Z944 Liver transplant status: ICD-10-CM

## 2016-09-16 DIAGNOSIS — T8131XA Disruption of external operation (surgical) wound, not elsewhere classified, initial encounter: ICD-10-CM

## 2016-09-16 DIAGNOSIS — L03113 Cellulitis of right upper limb: ICD-10-CM

## 2016-09-16 LAB — CBC
Lab: 17 % — ABNORMAL HIGH (ref 11–15)
Lab: 194 10*3/uL (ref 150–400)
Lab: 25 pg — ABNORMAL LOW (ref 26–34)
Lab: 32 g/dL (ref 32.0–36.0)
Lab: 41 % (ref 40–50)
Lab: 78 FL — ABNORMAL LOW (ref 80–100)
Lab: 8.9 10*3/uL (ref 4.5–11.0)
Lab: 9 FL (ref 7–11)

## 2016-09-16 LAB — COMPREHENSIVE METABOLIC PANEL
Lab: 0.4 mg/dL (ref 0.3–1.2)
Lab: 1 mg/dL (ref 0.4–1.24)
Lab: 107 MMOL/L (ref 98–110)
Lab: 139 MMOL/L (ref 137–147)
Lab: 14 mg/dL — ABNORMAL LOW (ref 7–25)
Lab: 17 U/L (ref 7–56)
Lab: 21 U/L (ref 7–40)
Lab: 25 MMOL/L (ref 21–30)
Lab: 3.8 MMOL/L (ref 3.5–5.1)
Lab: 4 g/dL (ref 3.5–5.0)
Lab: 7 10*3/uL — ABNORMAL HIGH (ref 3–12)
Lab: 7.4 g/dL (ref 6.0–8.0)
Lab: 76 mg/dL — ABNORMAL LOW (ref 70–100)
Lab: 81 U/L — ABNORMAL LOW (ref 25–110)
Lab: 9.1 mg/dL — ABNORMAL HIGH (ref 8.5–10.6)
Lab: >60 mL/min (ref >60)
Lab: >60 mL/min — ABNORMAL HIGH (ref >60)

## 2016-09-16 LAB — CBC AND DIFF
Lab: 0.1 10*3/uL (ref 0–0.20)
Lab: 0.1 10*3/uL (ref 0–0.45)
Lab: 11 10*3/uL — ABNORMAL HIGH (ref 4.5–11.0)

## 2016-09-16 LAB — PHOSPHORUS: Lab: 3 mg/dL — ABNORMAL LOW (ref 2.0–4.0)

## 2016-09-16 LAB — MAGNESIUM: Lab: 1.6 mg/dL (ref 1.6–2.6)

## 2016-09-16 MED ORDER — PRAVASTATIN 20 MG PO TAB
10 mg | Freq: Every evening | ORAL | 0 refills | Status: DC
Start: 2016-09-16 — End: 2016-09-16
  Administered 2016-09-16: 06:00:00 10 mg via ORAL

## 2016-09-16 MED ORDER — TACROLIMUS 1 MG PO CAP
1 mg | Freq: Two times a day (BID) | ORAL | 0 refills | Status: DC
Start: 2016-09-16 — End: 2016-09-16
  Administered 2016-09-16: 14:00:00 1 mg via ORAL

## 2016-09-16 MED ORDER — PREDNISONE 5 MG PO TAB
5 mg | Freq: Every day | ORAL | 0 refills | Status: DC
Start: 2016-09-16 — End: 2016-09-16
  Administered 2016-09-16: 14:00:00 5 mg via ORAL

## 2016-09-16 MED ORDER — CLINDAMYCIN IN 5 % DEXTROSE 900 MG/50 ML IV PGBK
900 mg | INTRAVENOUS | 0 refills | Status: DC
Start: 2016-09-16 — End: 2016-09-16
  Administered 2016-09-16 (×2): 900 mg via INTRAVENOUS

## 2016-09-16 MED ORDER — EVEROLIMUS (IMMUNOSUPPRESSIVE) 0.5 MG PO TAB
2 mg | Freq: Two times a day (BID) | ORAL | 0 refills | Status: DC
Start: 2016-09-16 — End: 2016-09-16
  Administered 2016-09-16: 14:00:00 2 mg via ORAL

## 2016-09-16 MED ORDER — CALCIUM CARBONATE-VITAMIN D3 500 MG(1,250MG) -200 UNIT PO TAB
1 | Freq: Two times a day (BID) | ORAL | 0 refills | Status: DC
Start: 2016-09-16 — End: 2016-09-16
  Administered 2016-09-16: 14:00:00 1 via ORAL

## 2016-09-16 MED ORDER — OXYCODONE 5 MG PO TAB
5-10 mg | ORAL | 0 refills | Status: DC | PRN
Start: 2016-09-16 — End: 2016-09-16

## 2016-09-16 MED ORDER — METOPROLOL TARTRATE 25 MG PO TAB
12.5 mg | Freq: Two times a day (BID) | ORAL | 0 refills | Status: DC
Start: 2016-09-16 — End: 2016-09-16
  Administered 2016-09-16: 14:00:00 12.5 mg via ORAL

## 2016-09-16 MED ORDER — CLINDAMYCIN HCL 300 MG PO CAP
300 mg | ORAL_CAPSULE | ORAL | 0 refills | Status: AC
Start: 2016-09-16 — End: ?
  Filled 2016-09-16 (×2): qty 28, 7d supply, fill #1

## 2016-09-16 MED ORDER — PANTOPRAZOLE 40 MG PO TBEC
40 mg | Freq: Every day | ORAL | 0 refills | Status: DC
Start: 2016-09-16 — End: 2016-09-16

## 2016-09-16 MED ORDER — HELP MEDICATION
Freq: Every day | 0 refills | Status: DC
Start: 2016-09-16 — End: 2016-09-16

## 2016-09-16 MED ORDER — HELP MEDICATION
Freq: Two times a day (BID) | 0 refills | Status: DC
Start: 2016-09-16 — End: 2016-09-16

## 2016-09-16 MED ORDER — CHOLECALCIFEROL (VITAMIN D3) 1,000 UNIT PO TAB
2000 [IU] | Freq: Two times a day (BID) | ORAL | 0 refills | Status: DC
Start: 2016-09-16 — End: 2016-09-16
  Administered 2016-09-16: 14:00:00 2000 [IU] via ORAL

## 2016-09-16 MED ORDER — ASPIRIN 81 MG PO TBEC
81 mg | Freq: Every day | ORAL | 0 refills | Status: DC
Start: 2016-09-16 — End: 2016-09-16
  Administered 2016-09-16: 14:00:00 81 mg via ORAL

## 2016-09-16 MED ORDER — ENOXAPARIN 40 MG/0.4 ML SC SYRG
40 mg | Freq: Every day | SUBCUTANEOUS | 0 refills | Status: DC
Start: 2016-09-16 — End: 2016-09-16

## 2016-09-16 MED ORDER — SENNOSIDES-DOCUSATE SODIUM 8.6-50 MG PO TAB
1 | Freq: Every day | ORAL | 0 refills | Status: DC
Start: 2016-09-16 — End: 2016-09-16
  Administered 2016-09-16: 14:00:00 1 via ORAL

## 2016-09-16 MED ADMIN — SODIUM CHLORIDE 0.9 % IV SOLP [27838]: 1000 mL | INTRAVENOUS | @ 06:00:00 | Stop: 2016-09-16 | NDC 00338004904

## 2016-09-16 NOTE — Progress Notes
RESPIRATORY THERAPY  ADULT PROTOCOL EVALUATION      RESPIRATORY PROTOCOL PLAN    Medications       Note: If indicated by protocol, medication orders will be placed by therapist.    Procedures          _____________________________________________________________    PATIENT EVALUATION RESULTS    Chart Review  * Pulmonary Hx: No pulmonary diagnosis OR no smoking hx    * Surgical Hx: No surgery OR last surgery > 6 weeks ago OR trach/stoma (BA)    * Chest X-Ray: Clear OR not available    * PFT/Oxygenation: FEV1, PEFR > 80% predicted OR physically unable to perform OR Pa02 >80 RA OR Sp02 >95% RA      Patient Assessment  * Respiratory Pattern: Regular pattern and rate OR good chest excursion with deep breathing    * Breath Sounds: Clear and able to auscultate bases posteriorly    * Cough / Sputum: Strong, effective cough OR nonproductive    * Mental Status: Alert, oriented, cooperative    * Activity Level: Ambulatory      Priority Index  Total Points: 0 Points  * Priority Index: Criteria not met    PRIORITY INDEX GUIDELINES*  Priority Points   1 0-9 points   2 9-18 points   3 > 18 points   + Pulm Dx or Home Rx   *Higher points indicate higher acuity.      Therapist: Berniece Andreas, RT  Date: 09/16/2016      Key  AC = Airway clearance  AM = Aerosolized medication  BA = Bland aerosol  DB&C = Deep breathe & cough  FEV1 = Forced expiratory volume in first second)  IC = Inspiratory capacity  LE = Lung expansion  MDI = Metered dose inhaler  Neb = Nebulizer  O2 = Oxygen  Oxim =Oximetry  PEFR = Peak expiratory flow rate  RRT = Rapid Response Team

## 2016-09-16 NOTE — Progress Notes
Patient arrived to unit via wheelchair accompanied by transport staff. Patient ambulated to bed. Orders released, reviewed, and implemented as appropriate. Oriented to surroundings, call light within reach. POC reviewed. Will continue to monitor.

## 2016-09-16 NOTE — ED Notes
CA 7130 (ready) please call Katie at [phone]

## 2016-09-16 NOTE — Progress Notes
Ricky Rhodes discharged on 09/16/2016.  Discharge instructions reviewed with patient and wife.  Valuables returned: Yes  Personal Items / Valuables: Valuables/Belongings home with patient.  Home medications: None  Functional assessment at discharge complete: Yes.    PIV removed prior to discharge. Clarified order for doxycycline, team did not intend to order for patient, only clindamycin. Updated patient and wife.

## 2016-09-20 ENCOUNTER — Encounter: Admit: 2016-09-20 | Discharge: 2016-09-20 | Payer: MEDICARE

## 2016-09-20 DIAGNOSIS — K754 Autoimmune hepatitis: Principal | ICD-10-CM

## 2016-09-20 DIAGNOSIS — C801 Malignant (primary) neoplasm, unspecified: ICD-10-CM

## 2016-09-20 DIAGNOSIS — K7460 Unspecified cirrhosis of liver: ICD-10-CM

## 2016-09-20 DIAGNOSIS — Z944 Liver transplant status: ICD-10-CM

## 2016-09-20 DIAGNOSIS — C44622 Squamous cell carcinoma of skin of right upper limb, including shoulder: Principal | ICD-10-CM

## 2016-09-20 DIAGNOSIS — T148XXA Other injury of unspecified body region, initial encounter: ICD-10-CM

## 2016-09-20 DIAGNOSIS — N200 Calculus of kidney: ICD-10-CM

## 2016-09-20 DIAGNOSIS — T8131XA Disruption of external operation (surgical) wound, not elsewhere classified, initial encounter: ICD-10-CM

## 2016-09-20 DIAGNOSIS — IMO0002 Squamous cell carcinoma: ICD-10-CM

## 2016-09-20 DIAGNOSIS — I8500 Esophageal varices without bleeding: ICD-10-CM

## 2016-09-20 DIAGNOSIS — A0472 Enterocolitis due to Clostridium difficile, not specified as recurrent: ICD-10-CM

## 2016-09-20 DIAGNOSIS — K769 Liver disease, unspecified: ICD-10-CM

## 2016-09-20 NOTE — Progress Notes
Name: Ricky Rhodes          MRN: [id number]      DOB: [birth year]      AGE: 41 y.o.   DATE OF SERVICE: 09/20/2016    Subjective:             Reason for Visit:  Heme/Onc Care      Ricky Rhodes is a 41 y.o. male.     Cancer Staging  No matching staging information was found for the patient.    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  His post-operative course was complicated by a readmission on 09/15/16 due to wound dehiscence, erythema, and pain which was treated with IV clindamycin for 24 hours.  He was then discharged on 7 days of PO Clindamycin. Overall he states he is doing well.  Says his symptoms have resolved other than minimal swelling of the area. Denies pain. Denies drainage from the wounds.  Denies fevers or chills.     Pathology from the wide local excisions negative for residual carcinoma.       Review of Systems   Constitutional: Negative.    HENT: Negative.    Eyes: Negative.    Respiratory: Negative.    Cardiovascular: Negative.    Gastrointestinal: Negative.    Endocrine: Negative.    Genitourinary: Negative.    Musculoskeletal: Negative.    Skin:        Open wounds on right hand with swelling   Allergic/Immunologic: Negative.    Neurological: Negative.    Hematological: Negative.    Psychiatric/Behavioral: Negative.          Objective:         ??? acetaminophen SR(+) (TYLENOL) 650 mg tablet Take one tablet by mouth every 6 hours as needed for Pain.   ??? aspirin EC 81 mg tablet Take 81 mg by mouth daily. Take with food.   ??? calcium carbonate-vitamin D3 600 mg(1,500mg ) -800 unit tab Take 1 Tab by mouth twice daily.   ??? Cholecalciferol (Vitamin D3) 2,000 unit cap Take 2,000 Units by mouth twice daily.   ??? clindamycin(+) (CLEOCIN HCL) 300 mg capsule Take one capsule by mouth every 6 hours for 7 days. Take with 8oz of water.   ??? doxycycline (VIBRAMYCIN) 100 mg tablet Take one tablet by mouth twice daily.   ??? everolimus (immunosuppressive) (ZORTRESS) 0.5 mg tablet Take 4 tablets by mouth twice daily.   ??? metoprolol tartrate (LOPRESSOR) 25 mg tablet Take 0.5 tablets by mouth twice daily.   ??? omeprazole DR(+) (PRILOSEC) 20 mg capsule Take 1 capsule by mouth twice daily.   ??? oxyCODONE (ROXICODONE, OXY-IR) 5 mg tablet Take one tablet by mouth every 4 hours as needed for Pain   ??? pravastatin (PRAVACHOL) 10 mg tablet Take 1 tablet by mouth at bedtime daily.   ??? prednisone (DELTASONE) 5 mg tablet Take one tablet by mouth daily with breakfast.   ??? senna/docusate (SENOKOT-S) 8.6/50 mg tablet Take one tablet by mouth daily.   ??? tacrolimus (PROGRAF) 1 mg capsule Take 1 mg by mouth twice daily.     Vitals:    09/20/16 1037   BP: 135/87   Pulse: 60   Resp: 18   Temp: 36.6 ???C (97.9 ???F)   TempSrc: Oral   SpO2: 99%   Weight: 88.4 kg (194 lb 12.8 oz)   Height: 167.6 cm (65.98)  Body mass index is 31.46 kg/m???.     Pain Score: Zero       Pathology Final Diagnosis:     A. Right base of thumb, medial margin for frozen section diagnosis:   -- Actinic keratosis; negative for carcinoma     B. Right base of thumb, lateral margin for frozen section diagnosis:   -- Actinic keratosis; negative for carcinoma     C. Medial right hand, medial margin for frozen section diagnosis:   -- Actinic keratosis; negative for carcinoma     D. Medial right hand, lateral margin for frozen section diagnosis:   -- Actinic keratosis; negative for carcinoma     E. Base of right thumb, wide local excision:   -- Actinic keratosis; negative for residual carcinoma     F. Medial right hand, wide local excision:   -- Actinic keratosis and scar tissue; negative for residual carcinoma            Physical Exam   Constitutional: He appears well-developed and well-nourished. No distress.   HENT:   Head: Normocephalic and atraumatic.   Eyes: Conjunctivae are normal. No scleral icterus.   Neck: No tracheal deviation present. Cardiovascular: Normal rate and regular rhythm.    Pulmonary/Chest: Effort normal. No respiratory distress.   Musculoskeletal: He exhibits edema (Minimal peri-incisional edema of the right hand).   Neurological: He is alert.   Skin: Skin is warm and dry. Capillary refill takes less than 2 seconds.   R hand: 2 incisions over dorsum of R hand with small area of superficial separation with fibrinous granulation tissue at bases and no surrounding erythema or drainage, minimal peri-incisional edema, minimal TTP   Psychiatric: He has a normal mood and affect. His behavior is normal. Judgment and thought content normal.             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    -Pathology reviewed and no evidence of residual carcinoma.  -Wound healing with improving areas of dehiscence, no residual erythema, and minimal edema.  -Will have him return to clinic in 1 month to reassess.    Ezzie Dural, MD  Pager: [phone]              I have reviewed the above note, repeated key elements, and agree with the assessment and plan.

## 2016-09-21 NOTE — Discharge Instructions - Pharmacy
Physician Discharge Summary      Name: Ricky Rhodes  Medical Record Number: [id number]        Account Number:  [id number]  Date Of Birth:  06/22/1975                         Age:  41 years   Admit date:  09/15/2016                     Discharge date:  09/16/2016    Attending Physician:  Lorelei Pont               Service: Surgery-Oncology    Physician Summary completed by: Lorelei Pont, MD    Reason for hospitalization: Cellulitis of right hand    Significant PMH:   Past Medical History:   Diagnosis Date   ??? Autoimmune hepatitis (HCC)    ??? C. difficile diarrhea April 2014   ??? [facility])    ??? Cirrhosis of liver without mention of alcohol    ??? Esophageal varices (HCC)    ??? Fracture    ??? Liver disease, chronic    ??? 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 Nephrolithiasis    ??? Squamous cell carcinoma     bilateral hands         Allergies: Valium [diazepam]    Admission Physical Exam notable for:  Mild swelling of right hand, wide local excision sites slightly open, no purulence noted      Admission Lab/Radiology studies notable for: n/a    Brief Hospital Course:  The patient was admitted and the following issues were addressed during this hospitalization: (with pertinent details).  Ricky Rhodes was noted to have swelling and mild cellulitis around his right hand wide local excision sites.  He was admitted due to pain and his immunosuppression.  IV antibiotics were initially administered and then he was transitioned to oral antibiotics after his cellulitis and pain improved.      Condition at Discharge: Stable    Discharge Diagnoses:  Right hand cellulitis    Hospital Problems        Active Problems    Infection          Surgical Procedures: None    Significant Diagnostic Studies and Procedures: none    Consults:  None    Patient Disposition: Home       Patient instructions/medications:     Activity as Tolerated   It is important to keep increasing your activity level after you leave the hospital.  Moving around can help prevent blood clots, lung infection (pneumonia) and other problems.  Gradually increasing the number of times you are up moving around will help you return to your normal activity level more quickly.  Continue to increase the number of times you are up to the chair and walking daily to return to your normal activity level. Begin to work towards your normal activity level at discharge.     Driving Restrictions   No driving while taking pain medication.     Discharge Signs/Symptoms   Colon Rectal Please contact your doctor if you have any of the following symptoms:  *Temperature over 101 F  *Increased redness, tenderness, or swelling at the incision site  *Excessive drainage from the incision  *Drainage that smells bad  *If the edges of your incision come apart  *Severe abdominal pain with or without bloating  *Constant nausea and/or vomiting  Regular Diet   You have no dietary restriction. Please continue with a healthy balanced diet.     Incision Care   Colon Rectal *Keep the incision clean.  *Let warm soapy water run over it and pat dry.  *If there is drainage from your incision cover your incision with dry gauze.  *Change the gauze twice a day.     Return Appointment   [facility]  [address] [place] [address]  [phone]   [place] Provider Lorelei Pont [[id number]]    Appointment date: 09/20/2016    Appointment time: 10:10 AM      Opioid (Narcotic) Safety Information   OPIOID (NARCOTIC) PAIN MEDICATION SAFETY    We care about your comfort, and believe you need opioid medications at this time to treat your pain.  An opioid is a strong pain medication.  It is only available by prescription for moderate to severe pain.  Usually these medications are used for only a short time to treat pain, but sometimes will be prescribed for longer.  Talk with your doctor or nurse about how long they expect you to need this medication. When used the right way, opioids are safe and effective medications to treat your pain, even when used for a long time.  Yet, when used in the wrong way, opioids can be dangerous for you or others.  Opioids do not work for everyone.  Most patients do not get full relief of their pain from opioid medication; full relief of your pain may not be possible.     For your safety, we ask you to follow these instructions:    *Only take your opioid medication as prescribed.  If your pain is not controlled with the prescribed dose, or the medication is not lasting long enough, call your doctor.  *Do not break or crush your opioid medication unless your doctor or pharmacist says you can.  With certain medications, this can be dangerous, and may cause death.  *Never share your medications with others, even if they appear to have a good reason.  Never take someone else's pain medication-this is dangerous, and illegal (a crime).  Overdoses and deaths have occurred.  *Keep your opioid medications safe, as you would with cash, in a lock box or similar container.  *Make sure your opioids are going to be secure, especially if you are around children or teens.  *Talk with your doctor or pharmacist before you take other medications.  *Avoid driving, operating machinery, or drinking alcohol while taking opioid pain medication.  This may be unsafe.    Pain medications can cause constipation. Constipation is bowel movements that are less often than normal. Stools often become very hard and difficult to pass. This may lead to stomach pain and bloating. It may also cause pain when trying to use the bathroom. Constipation may be treated with suppositories, laxatives or stool softeners. A diet high in fiber with plenty of fluids helps to maintain regular, soft bowel movements.     Questions About Your Stay   Standard  For questions or concerns regarding your hospital stay:    -DURING BUSINESS HOUR (8:00 AM - 4:30 PM): Call [phone] and ask to be transferred to your discharge attending physician.    -AFTER BUSINESS HOURS (4:30 PM - 8:00 AM, on weekends, or holidays):  Call [phone] and ask the operator to page the on-call doctor for the discharge attending physician.   Discharging attending physician: Lorelei Pont [[id number]]  Current Discharge Medication List       START taking these medications    Details   clindamycin(+) (CLEOCIN HCL) 300 mg capsule Take one capsule by mouth every 6 hours for 7 days. Take with 8oz of water.  Qty: 28 capsule, Refills: 0    PRESCRIPTION TYPE:  Print      doxycycline (VIBRAMYCIN) 100 mg tablet Take one tablet by mouth twice daily.  Qty: 14 tablet, Refills: 0    PRESCRIPTION TYPE:  Normal          CONTINUE these medications which have NOT CHANGED    Details   acetaminophen SR(+) (TYLENOL) 650 mg tablet Take one tablet by mouth every 6 hours as needed for Pain.  Qty: 60 tablet, Refills: 1    PRESCRIPTION TYPE:  Print      aspirin EC 81 mg tablet Take 81 mg by mouth daily. Take with food.    PRESCRIPTION TYPE:  Historical Med      calcium carbonate-vitamin D3 600 mg(1,500mg ) -800 unit tab Take 1 Tab by mouth twice daily.    PRESCRIPTION TYPE:  Historical Med      Cholecalciferol (Vitamin D3) 2,000 unit cap Take 2,000 Units by mouth twice daily.    PRESCRIPTION TYPE:  Historical Med      everolimus (immunosuppressive) (ZORTRESS) 0.5 mg tablet Take 4 tablets by mouth twice daily.  Qty: 240 tablet, Refills: 5    PRESCRIPTION TYPE:  Normal  Comments: Please HOLD until assistance application is processed.     ICD-10:Z94.4      metoprolol tartrate (LOPRESSOR) 25 mg tablet Take 0.5 tablets by mouth twice daily.  Qty: 90 tablet, Refills: 1    PRESCRIPTION TYPE:  Normal      omeprazole DR(+) (PRILOSEC) 20 mg capsule Take 1 capsule by mouth twice daily.  Qty: 60 capsule, Refills: 11    PRESCRIPTION TYPE:  Normal      oxyCODONE (ROXICODONE, OXY-IR) 5 mg tablet Take one tablet by mouth every 4 hours as needed for Pain  Qty: 30 tablet, Refills: 0    PRESCRIPTION TYPE:  Print      pravastatin (PRAVACHOL) 10 mg tablet Take 1 tablet by mouth at bedtime daily.  Qty: 90 tablet, Refills: 1    PRESCRIPTION TYPE:  Normal      prednisone (DELTASONE) 5 mg tablet Take one tablet by mouth daily with breakfast.  Qty: 90 tablet, Refills: 1    PRESCRIPTION TYPE:  Normal      senna/docusate (SENOKOT-S) 8.6/50 mg tablet Take one tablet by mouth daily.  Qty: 60 tablet, Refills: 1    PRESCRIPTION TYPE:  Print  Comments: To be taken with narcotic pain medication. Hold for loose stools.      tacrolimus (PROGRAF) 1 mg capsule Take 1 mg by mouth twice daily.    PRESCRIPTION TYPE:  Historical Med              Scheduled appointments:    Oct 25, 2016 10:10 AM CDT  (Arrive by 9:55 AM)  Return Patient with Lorelei Pont, MD  [facility] Exam [facility] Exam) [facility]  [address] [place] [address]  [phone]   Jul 16, 2017 10:00 AM CDT  Return Patient with Rodena Piety, MD  [facility] 1st Fl  [address]  [place] [address]  [phone]   Jul 16, 2017 11:30 AM CDT  DOPPLER ABD PELV RETROPER COMP with SONOGRAPHY-MOB  [facility] Radiology [facility] Radiology) [address] Med Office [place]  [address]  [place] [address]  [phone]          Pending items needing follow up: None; follow up in one week    Signed:  Lorelei Pont, MD  09/21/2016      cc:  Primary Care Physician:  Bayard Beaver   Verified  Referring physicians:  No ref. provider found   Additional provider(s):

## 2016-09-24 ENCOUNTER — Encounter: Admit: 2016-09-24 | Discharge: 2016-09-24 | Payer: MEDICARE

## 2016-09-24 DIAGNOSIS — K769 Liver disease, unspecified: ICD-10-CM

## 2016-09-24 DIAGNOSIS — IMO0002 Squamous cell carcinoma: ICD-10-CM

## 2016-09-24 DIAGNOSIS — K7460 Unspecified cirrhosis of liver: ICD-10-CM

## 2016-09-24 DIAGNOSIS — T148XXA Other injury of unspecified body region, initial encounter: ICD-10-CM

## 2016-09-24 DIAGNOSIS — N200 Calculus of kidney: ICD-10-CM

## 2016-09-24 DIAGNOSIS — C801 Malignant (primary) neoplasm, unspecified: ICD-10-CM

## 2016-09-24 DIAGNOSIS — Z944 Liver transplant status: ICD-10-CM

## 2016-09-24 DIAGNOSIS — A0472 Enterocolitis due to Clostridium difficile, not specified as recurrent: ICD-10-CM

## 2016-09-24 DIAGNOSIS — K754 Autoimmune hepatitis: Principal | ICD-10-CM

## 2016-09-24 DIAGNOSIS — I8500 Esophageal varices without bleeding: ICD-10-CM

## 2016-10-24 ENCOUNTER — Encounter: Admit: 2016-10-24 | Discharge: 2016-10-24 | Payer: MEDICARE

## 2016-10-24 NOTE — Telephone Encounter
Ricky Rhodes, Ricky Rhodes (MRN: [id number]) wife called to cancel husband's Thurs. (10/11) at 1010 appt with Dr. Cathlean Marseilles.  She will have husband call back to reschedule the appt. Routed O2 phone message to Daleen Squibb, RN. [place], AA.

## 2016-11-02 ENCOUNTER — Encounter: Admit: 2016-11-02 | Discharge: 2016-11-02 | Payer: MEDICARE

## 2016-11-02 DIAGNOSIS — C44629 Squamous cell carcinoma of skin of left upper limb, including shoulder: Principal | ICD-10-CM

## 2016-11-02 NOTE — Telephone Encounter
Received a fax from dermatology of another [facility] on the left third finger. Due to the frequency of new SCCs developing and the extensive surgery that has already been performed on his hands, Dr. Aundra Dubin would like for pt to see medical oncology for a consultation prior to planning another resection. Wife notified of plan. She is agreeable. We will plan to see pt in clinic with Dr. Aundra Dubin after consultation with medical oncology.

## 2016-11-13 ENCOUNTER — Encounter: Admit: 2016-11-13 | Discharge: 2016-11-13 | Payer: MEDICARE

## 2016-11-13 DIAGNOSIS — C44629 Squamous cell carcinoma of skin of left upper limb, including shoulder: Principal | ICD-10-CM

## 2016-11-13 NOTE — Progress Notes
Name: Ricky Rhodes          MRN: [id number]      DOB: 08/22/1975      AGE: 41 y.o.   DATE OF SERVICE: 11/13/2016    Subjective:             Reason for Visit:  Heme/Onc Care      Ricky Rhodes is a 41 y.o. male.     Cancer Staging  No matching staging information was found for the patient.    History of Present Illness  Mr. 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     He reports he was initially placed on Cellcept following OLT and had a lot of skin issues.  He was switched to everolimus and reports he did well.  Unfortunately he broke his leg and had to go back on Cellcept for a period of time and his wife reports skin blew up again.  He has been back on everolimus around 6 months. He has been following with Dr. Wallie Char for surgical excision.  He recently had biopsy by an outside dermatologist of left third finger revealing another squamous cell carcinoma.  Due to the frequency of SCCs and the extensive surgeries performed on his hands to date, Dr. Wallie Char requested consultation prior to planning additional resection.    He feels very well without symptoms or other concerns today.  He continues to follow closely with hepatology at this institution.  Past Medical History:   Diagnosis Date   ??? Autoimmune hepatitis (HCC)    ??? C. difficile diarrhea April 2014   ??? Cancer [facility])    ??? Cirrhosis of liver without mention of alcohol    ??? Esophageal varices (HCC)    ??? Fracture    ??? Liver disease, chronic    ??? 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 Nephrolithiasis    ??? Squamous cell carcinoma     bilateral hands     Past Surgical History:   Procedure Laterality Date   ??? HERNIA REPAIR  01/2012   ??? LIVER TRANSPLANT  06/2012   ??? SKIN CANCER EXCISION Bilateral 12/01/2013    wide local excision of arms with sentinel lymph node biopsy   ??? PR EXCISION MALIGNANT LESION TRUNK/ARM/LEG > 4.0 CM Left 02/18/2015 WIDE LOCAL EXCISION OF LEFT SECOND FINGER, and left forearm excision of lesion performed by L[facility]lei Pont, [facility]   ??? PR APPLICATION MULTIPLANE EXTERNAL FIXATION SYSTEM Left 07/30/2015    LEFT ANKLE EXTERNAL FIXATION APPLICATION  performed by Windell Norfolk, MD at [facility]   ??? PR OPEN TREATMENT FRACTURE DISTAL TIBIA ONLY Left 08/19/2015    OPEN REDUCTION INTERNAL FIXATION LEFT PILON performed by Windell Norfolk, MD at [facility]   ??? PR REMOVAL EXTERNAL FIXATION SYSTEM UNDER ANES Left 08/19/2015    EXTERNAL FIXATION REMOVAL LEFT ANKLE performed by Windell Norfolk, MD at [facility]   ??? SKIN CANCER EXCISION Bilateral 02/28/2016    WIDE LOCAL EXCISION OF RIGHT HAND, LOCAL EXCISION OF LEFT FOREARM performed by Lorelei Pont, MD at [facility] OR/Periop   ??? MALIGNANT SKIN LESION EXCISION Right 09/03/2016    WIDE LOCAL EXCISION OF RIGHT HAND x 2 performed by Lorelei Pont, MD at [facility] OR/Periop   ??? MALIGNANT SKIN LESION EXCISION Right 09/03/2016    EXCISION LESION MALIGNANT 2.1 CM -  3.0 CM - HAND performed by Lorelei Pont, MD at [facility] OR/Periop   ??? FEMUR FRACTURE SURGERY     ??? UPPER GASTROINTESTINAL ENDOSCOPY      MAC's without complications     Family History   Problem Relation Age of Onset   ??? Hypertension Mother    ??? Arthritis-osteo Mother    ??? Heart Failure Sister    ??? Thyroid Disease Sister    ??? Cancer Maternal Grandmother    ??? Heart Failure Maternal Grandmother    ??? Arthritis-osteo Maternal Grandmother    ??? Heart Failure Maternal Grandfather      Social History     Social History   ??? Marital status: Married     Spouse name: N/A   ??? Number of children: N/A   ??? Years of education: N/A     Social History Main Topics   ??? Smoking status: Never Smoker   ??? Smokeless tobacco: Former Neurosurgeon     Types: Chew   ??? Alcohol use No   ??? Drug use: No   ??? Sexual activity: Not on file     Other Topics Concern   ??? Not on file     Social History Narrative   ??? No narrative on file        Review of Systems Constitutional: Negative.    Respiratory: Negative.    Gastrointestinal: Negative.    Skin:        See HPI   Neurological: Negative.    Psychiatric/Behavioral: Negative.    All other systems reviewed and are negative.        Objective:         ??? acetaminophen SR(+) (TYLENOL) 650 mg tablet Take one tablet by mouth every 6 hours as needed for Pain.   ??? aspirin EC 81 mg tablet Take 81 mg by mouth daily. Take with food.   ??? calcium carbonate-vitamin D3 600 mg(1,500mg ) -800 unit tab Take 1 Tab by mouth twice daily.   ??? Cholecalciferol (Vitamin D3) 2,000 unit cap Take 2,000 Units by mouth twice daily.   ??? doxycycline (VIBRAMYCIN) 100 mg tablet Take one tablet by mouth twice daily.   ??? everolimus (immunosuppressive) (ZORTRESS) 0.5 mg tablet Take 4 tablets by mouth twice daily.   ??? metoprolol tartrate (LOPRESSOR) 25 mg tablet Take 0.5 tablets by mouth twice daily.   ??? omeprazole DR(+) (PRILOSEC) 20 mg capsule Take 1 capsule by mouth twice daily.   ??? oxyCODONE (ROXICODONE, OXY-IR) 5 mg tablet Take one tablet by mouth every 4 hours as needed for Pain   ??? pravastatin (PRAVACHOL) 10 mg tablet Take 1 tablet by mouth at bedtime daily.   ??? prednisone (DELTASONE) 5 mg tablet Take one tablet by mouth daily with breakfast.   ??? senna/docusate (SENOKOT-S) 8.6/50 mg tablet Take one tablet by mouth daily.   ??? tacrolimus (PROGRAF) 1 mg capsule Take 1 mg by mouth twice daily.     Vitals:    11/13/16 1529   BP: (!) 136/100   Pulse: 76   Resp: 15   Temp: 36.7 ???C (98 ???F)   TempSrc: Oral   SpO2: 96%   Weight: 87.5 kg (193 lb)   Height: 167.6 cm (65.98)     Body mass index is 31.17 kg/m???.     Pain Score: Zero         Pain Addressed:  N/A    Patient Evaluated for a Clinical Trial: No treatment clinical trial available for this patient.  [place] Cooperative Oncology Group performance status is 0, Fully active, able to carry on all pre-disease performance without restriction.Marland Kitchen     Physical Exam Constitutional: He is oriented to person, place, and time. He appears well-developed and well-nourished. No distress.   HENT:   Head: Normocephalic.   Mouth/Throat: Oropharynx is clear and moist.   Neck: Normal range of motion. Neck supple.   Cardiovascular: Normal rate and regular rhythm.    Pulmonary/Chest: Effort normal and breath sounds normal.   Abdominal: Soft. Bowel sounds are normal. He exhibits no distension and no mass. There is no tenderness.   Musculoskeletal: Normal range of motion. He exhibits no edema.   Lymphadenopathy:     He has no cervical adenopathy.   Neurological: He is alert and oriented to person, place, and time. No cranial nerve deficit.   Skin: Skin is warm and dry.   Psychiatric: He has a normal mood and affect. His behavior is normal. Judgment and thought content normal.   Vitals reviewed.    CBC w/Diff    Lab Results   Component Value Date/Time    WBC 8.9 09/16/2016 05:38 AM    RBC 5.26 09/16/2016 05:38 AM    HGB 13.3 (L) 09/16/2016 05:38 AM    HCT 41.2 09/16/2016 05:38 AM    MCV 78.3 (L) 09/16/2016 05:38 AM    MCH 25.2 (L) 09/16/2016 05:38 AM    MCHC 32.2 09/16/2016 05:38 AM    RDW 17.5 (H) 09/16/2016 05:38 AM    PLTCT 194 09/16/2016 05:38 AM    MPV 9.0 09/16/2016 05:38 AM    Lab Results   Component Value Date/Time    NEUT 66 09/15/2016 06:33 PM    ANC 7.50 (H) 09/15/2016 06:33 PM    LYMA 20 (L) 09/15/2016 06:33 PM    ALC 2.20 09/15/2016 06:33 PM    MONA 12 09/15/2016 06:33 PM    AMC 1.30 (H) 09/15/2016 06:33 PM    EOSA 1 09/15/2016 06:33 PM    AEC 0.10 09/15/2016 06:33 PM    BASA 1 09/15/2016 06:33 PM    ABC 0.10 09/15/2016 06:33 PM      Comprehensive Metabolic Profile    Lab Results   Component Value Date/Time    NA 139 09/15/2016 06:33 PM    K 3.8 09/15/2016 06:33 PM    CL 107 09/15/2016 06:33 PM    CO2 25 09/15/2016 06:33 PM    GAP 7 09/15/2016 06:33 PM    BUN 14 09/15/2016 06:33 PM    CR 1.06 09/15/2016 06:33 PM    GLU 76 09/15/2016 06:33 PM    GLU 84 08/15/2016 08:15 AM Lab Results   Component Value Date/Time    CA 9.1 09/15/2016 06:33 PM    PO4 3.0 09/16/2016 05:38 AM    ALBUMIN 4.0 09/15/2016 06:33 PM    TOTPROT 7.4 09/15/2016 06:33 PM    ALKPHOS 81 09/15/2016 06:33 PM    AST 21 09/15/2016 06:33 PM    ALT 17 09/15/2016 06:33 PM    TOTBILI 0.4 09/15/2016 06:33 PM    GFR >60 09/15/2016 06:33 PM    GFRAA >60 09/15/2016 06:33 PM                Assessment and Plan:    Problem   Squamous Cell Carcinoma    Mr. 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     He reports he was initially placed on Cellcept following OLT and had a lot of skin issues.  He was switched to everolimus and reports he did well.  Unfortunately he broke his leg and had to go back on Cellcept for a period of time and his wife reports skin blew up again.  He has been back on everolimus around 6 months. He has been following with Dr. Wallie Char for surgical excision.  He recently had biopsy by an outside dermatologist of left third finger revealing another squamous cell carcinoma.         Squamous cell carcinoma  We have reviewed progress notes, operative reports and pathology related to his squamous cell carcinomas.  We discussed the situation with him.  At this time, we would continue to recommend surgical excision for management as opposed to systemic therapy particularly given his hx of OLT.  This was reviewed in detail with Mr. Checchi and his wife who asked appropriate questions and time was allowed to fully answer.  Will return to Dr. Wallie Char for definitive surgery to new SCC on left third finger.  We are happy to see him again at any point in the future.      The patient was seen and examined by Dr. Adora Fridge who formulated the above assessment and plan-Ro Orson Aloe, APRN    ATTESTATION    I personally performed the key portions of the E/M visit, discussed case with Nurse Practitioner and concur with documentation of history, physical exam, assessment, and treatment plan unless otherwise noted.    Staff name:  Lenell Antu, MD Date:  11/15/2016

## 2016-11-14 ENCOUNTER — Encounter: Admit: 2016-11-14 | Discharge: 2016-11-14 | Payer: MEDICARE

## 2016-11-14 DIAGNOSIS — N200 Calculus of kidney: ICD-10-CM

## 2016-11-14 DIAGNOSIS — I8500 Esophageal varices without bleeding: ICD-10-CM

## 2016-11-14 DIAGNOSIS — A0472 Enterocolitis due to Clostridium difficile, not specified as recurrent: ICD-10-CM

## 2016-11-14 DIAGNOSIS — K754 Autoimmune hepatitis: Principal | ICD-10-CM

## 2016-11-14 DIAGNOSIS — Z79899 Other long term (current) drug therapy: ICD-10-CM

## 2016-11-14 DIAGNOSIS — T148XXA Other injury of unspecified body region, initial encounter: ICD-10-CM

## 2016-11-14 DIAGNOSIS — Z944 Liver transplant status: ICD-10-CM

## 2016-11-14 DIAGNOSIS — IMO0002 Squamous cell carcinoma: ICD-10-CM

## 2016-11-14 DIAGNOSIS — K769 Liver disease, unspecified: ICD-10-CM

## 2016-11-14 DIAGNOSIS — K7460 Unspecified cirrhosis of liver: ICD-10-CM

## 2016-11-14 DIAGNOSIS — C801 Malignant (primary) neoplasm, unspecified: ICD-10-CM

## 2016-11-14 LAB — LIPID PROFILE
Lab: 144 — ABNORMAL HIGH (ref 0–100)
Lab: 155 — ABNORMAL HIGH (ref 0.0–149.0)
Lab: 217 — ABNORMAL HIGH (ref 0–200)
Lab: 42

## 2016-11-14 LAB — URINALYSIS DIPSTICK REFLEX TO CULTURE
Lab: 0.2 U/L (ref 0.2–1.0)
Lab: 6 g/dL (ref 4.5–8.0)
Lab: NEGATIVE U/L (ref 7–56)

## 2016-11-14 LAB — URINALYSIS MICROSCOPIC REFLEX TO CULTURE

## 2016-11-14 NOTE — Assessment & Plan Note
We have reviewed progress notes, operative reports and pathology related to his squamous cell carcinomas.  We discussed the situation with him.  At this time, we would continue to recommend surgical excision for management as opposed to systemic therapy particularly given his hx of OLT.  This was reviewed in detail with Ricky Rhodes and his wife who asked appropriate questions and time was allowed to fully answer.  Will return to Dr. Aundra Dubin for definitive surgery to new SCC on left third finger.  We are happy to see him again at any point in the future.

## 2016-11-14 NOTE — Telephone Encounter
[facility] lab to request standing lab results.  Lab reported standing orders were not drawn and confirmed monthly orders are on file.  Lab will contact pt to be redrawn.

## 2016-11-15 ENCOUNTER — Encounter: Admit: 2016-11-15 | Discharge: 2016-11-15 | Payer: MEDICARE

## 2016-11-15 NOTE — Progress Notes
[facility]   [facility]    Tumor Conference Date: 11/15/16  Patient:             Ricky Rhodes  Med Rec #:  [id number]  DOB:              1975/12/20      Presenting Physician: Adora Fridge  Medical Oncologist: Same  Surgeon: Prudencio Pair  Radiation Oncologist: na    Attendance: Medical Oncology, Radiation Oncology, Surgical Oncology, Pathology      Discussion:  Prospective discussion    Reason for presentation: discuss tx options    Clinical History/Significant PMHx:   Patient is a [place] Liver transplant patient of Dr Levada Dy.   TIMELINE:  10/01/13 A. Shave bx right hand dorsal proximal  B. Shave bx right hand dorsal distal  C. Shave bx right hand right pointer  D. Shave bx left hand dorsal proximal  E. Shave bx left hand dorsal distal A. Focal well-differentiated squamous cell carcinoma, focally extending to the deep margin of biopsy.  Background hypertrophic actinic keratosis.  B. Well differentiated squamous cell ca, focally involving the the deep margin. Background hypertrophic actinic keratosis.  C. Well differentiated squamous cell carcinoma, with adjacent squamous cell carcinoma in situ focally involving the side margin.  D. Well-differentiated squamous cell carcinoma involving the deep margin.  E. Superficially invasive well-differentiated squamous cell carcinoma, narrowly excised.   12/01/13 WLE with SLN bx A. Lymph nodes (3), right axillary sentinel lymph node #1, blue, count   406, lymphadenectomy:   --There is no evidence of malignancy in three lymph nodes. ???(0/3)   ???B. Lymph nodes (2), left axillary sentinel lymph node #1, count 1006,   lymphadenectomy:   --There is no evidence of malignancy in two lymph nodes. ???(0/2)   C. Lymph node (1), left axillary sentinel lymph node #2, count 668,   lymphadenectomy:   --There is no evidence of malignancy in one lymph node. ???(0/1)   D. Skin, wide local incision of left thumb-short proximal, long lateral,   excision: --Actinic keratosis. Dense dermal fibrosis and reactive epithelial changes consistent with previous biopsy site. No residual carcinoma is identified.   E. Skin, wide local excision of left fifth digit-short proximal, long   lateral, excision:   --Dermal scar tissue consistent with previous biopsy site. No residual carcinoma is identified.   F. Skin, right???radial dorsal hand-short is distal, long is radial, excision:   --Hyperkeratotic actinic keratosis. Dense dermal fibrosis and reactive epithelial changes consistent with previous biopsy site. No residual carcinoma is identified.   G. Skin, right index finger-short is distal, long is dorsal, excision:   --Actinic keratosis. Dense dermal fibrosis and reactive epithelial changes consistent with previous biopsy site. No residual carcinoma is identified.   H. Skin, right ulnar hand-short is distal, long is radial, excision:   --Well-differentiated squamous cell carcinoma; the excision margins are   free of involvement. Dense dermal fibrosis and reactive epithelial changes consistent with previous biopsy site.     02/18/15 Biopsies and WLE A. Skin, left second finger lateral margin, biopsy: ???   There is no evidence of malignancy.   B. Skin, left second finger medial margin, biopsy: ???   There is no evidence of malignancy.   C. Skin, left second finger proximal margin, biopsy: ???   There is no evidence of malignancy.   D. Skin, left forearm lesion, excision: ???   Prurigo nodule. See comment.   Solar lentigo.  E. Skin, wide local excision 2nd finger short proximal, long lateral, excision: ???   Actinic keratosis. See comment.   There is no residual squamous cell carcinoma.   Previous biopsy site changes.    02/28/16 Excisions A. Right hand, additional medial margin:   -- Negative for carcinoma   B. Right hand, additional lateral margin:   -- Negative for carcinoma   C. Right hand, additional distal margin:   -- Negative for carcinoma D. Right hand, wide local excision:   -- Microinvasive squamous cell carcinoma in background of actinic keratosis, completely excised   E. Left arm, direct excision:   -- Squamous cell carcinoma with keratoacanthomatoid features, completely   excised    04/11/16 Outside biopsy report    09/03/16 Excisions A. Right base of thumb, medial margin for frozen section diagnosis:   -- Actinic keratosis; negative for carcinoma   B. Right base of thumb, lateral margin for frozen section diagnosis:   -- Actinic keratosis; negative for carcinoma   C. Medial right hand, medial margin for frozen section diagnosis:   -- Actinic keratosis; negative for carcinoma   D. Medial right hand, lateral margin for frozen section diagnosis:   -- Actinic keratosis; negative for carcinoma   E. Base of right thumb, wide local excision:   -- Actinic keratosis; negative for residual carcinoma   F. Medial right hand, wide local excision:   -- Actinic keratosis and scar tissue; negative for residual carcinoma        DIAGNOSIS: Squamous cell ca    Date of Diagnosis: 09/2013     Staging: Cancer Staging  No matching staging information was found for the patient.    RECOMMENDATIONS:       Chemo/Immunotherapy: May explore Topical creams vs Anti PD1.  Must be cautious on treatment since he is a Liver transplant patient.         Radiotherapy: No         Surgery: No further excision.     NCCN or other national guidelines discussed:  Yes    Candidate for open clinical trial discussed:  Yes    Staging discussed:  Yes

## 2016-11-16 ENCOUNTER — Encounter: Admit: 2016-11-16 | Discharge: 2016-11-16 | Payer: MEDICARE

## 2016-11-22 ENCOUNTER — Encounter: Admit: 2016-11-22 | Discharge: 2016-11-22 | Payer: MEDICARE

## 2016-11-22 DIAGNOSIS — T148XXA Other injury of unspecified body region, initial encounter: ICD-10-CM

## 2016-11-22 DIAGNOSIS — Z944 Liver transplant status: ICD-10-CM

## 2016-11-22 DIAGNOSIS — K754 Autoimmune hepatitis: Principal | ICD-10-CM

## 2016-11-22 DIAGNOSIS — IMO0002 Squamous cell carcinoma: ICD-10-CM

## 2016-11-22 DIAGNOSIS — C4492 Squamous cell carcinoma of skin, unspecified: Principal | ICD-10-CM

## 2016-11-22 DIAGNOSIS — K769 Liver disease, unspecified: ICD-10-CM

## 2016-11-22 DIAGNOSIS — N200 Calculus of kidney: ICD-10-CM

## 2016-11-22 DIAGNOSIS — I8500 Esophageal varices without bleeding: ICD-10-CM

## 2016-11-22 DIAGNOSIS — A0472 Enterocolitis due to Clostridium difficile, not specified as recurrent: ICD-10-CM

## 2016-11-22 DIAGNOSIS — C801 Malignant (primary) neoplasm, unspecified: ICD-10-CM

## 2016-11-22 DIAGNOSIS — K7460 Unspecified cirrhosis of liver: ICD-10-CM

## 2016-11-25 ENCOUNTER — Encounter: Admit: 2016-11-25 | Discharge: 2016-11-25 | Payer: MEDICARE

## 2016-11-25 DIAGNOSIS — K754 Autoimmune hepatitis: Principal | ICD-10-CM

## 2016-11-25 DIAGNOSIS — N200 Calculus of kidney: ICD-10-CM

## 2016-11-25 DIAGNOSIS — K7460 Unspecified cirrhosis of liver: ICD-10-CM

## 2016-11-25 DIAGNOSIS — IMO0002 Squamous cell carcinoma: ICD-10-CM

## 2016-11-25 DIAGNOSIS — T148XXA Other injury of unspecified body region, initial encounter: ICD-10-CM

## 2016-11-25 DIAGNOSIS — Z944 Liver transplant status: ICD-10-CM

## 2016-11-25 DIAGNOSIS — I8500 Esophageal varices without bleeding: ICD-10-CM

## 2016-11-25 DIAGNOSIS — C801 Malignant (primary) neoplasm, unspecified: ICD-10-CM

## 2016-11-25 DIAGNOSIS — K769 Liver disease, unspecified: ICD-10-CM

## 2016-11-25 DIAGNOSIS — A0472 Enterocolitis due to Clostridium difficile, not specified as recurrent: ICD-10-CM

## 2016-11-26 ENCOUNTER — Encounter: Admit: 2016-11-26 | Discharge: 2016-11-26 | Payer: MEDICARE

## 2016-11-26 DIAGNOSIS — Z79899 Other long term (current) drug therapy: Principal | ICD-10-CM

## 2016-11-26 DIAGNOSIS — Z944 Liver transplant status: ICD-10-CM

## 2016-11-26 LAB — COMPREHENSIVE METABOLIC PANEL
Lab: 0.4 — ABNORMAL HIGH (ref 0–12)
Lab: 0.9
Lab: 109 — ABNORMAL HIGH (ref 98–107)
Lab: 142
Lab: 3.9
Lab: 4.1 MMOL/L — ABNORMAL LOW (ref 137–147)
Lab: 43
Lab: 58
Lab: 7.4 10*3/uL (ref 4.5–11.0)
Lab: 8.7
Lab: 83
Lab: 88 mg/dL — ABNORMAL HIGH (ref 0.3–0.8)
Lab: 11

## 2016-11-26 LAB — CBC AND DIFF
Lab: 0.1 U/L — ABNORMAL HIGH (ref 100–210)
Lab: 0.2 mg/dL — ABNORMAL LOW (ref 2.0–4.5)
Lab: 5.5
Lab: 6.6

## 2016-11-26 LAB — URINALYSIS MICROSCOPIC REFLEX TO CULTURE

## 2016-11-26 LAB — URINALYSIS DIPSTICK REFLEX TO CULTURE
Lab: 0.2
Lab: NEGATIVE
Lab: NEGATIVE
Lab: NEGATIVE
Lab: NEGATIVE
Lab: NEGATIVE

## 2016-11-26 LAB — EVEROLIMUS BLOOD: Lab: 7.3 — ABNORMAL LOW (ref 90–130)

## 2016-11-26 LAB — TACROLIMUS IMMUNOASSAY (FK506): Lab: 1.7 — ABNORMAL LOW (ref 5.0–20.0)

## 2016-11-26 LAB — GGTP: Lab: 30 — ABNORMAL LOW (ref 25.7–33.8)

## 2016-11-26 NOTE — Progress Notes
Name: Ricky Rhodes          MRN: [id number]      DOB: Apr 12, 1975      AGE: 41 y.o.   DATE OF SERVICE: 11/22/2016    Subjective:             Reason for Visit:  Heme/Onc Care      Ricky Rhodes is a 41 y.o. male.     Cancer Staging  No matching staging information was found for the patient.    History of Present Illness  Ricky Rhodes is a pleasant 41 year old male who is s/p orthotopic liver transplantation who has had numerous squamous cell cancers of his bilateral hands.  He presents with a new left third finger squamous cell cancer.       Review of Systems   Constitutional: Negative.    HENT: Negative.    Eyes: Negative.    Respiratory: Negative.    Cardiovascular: Negative.    Gastrointestinal: Negative.    Endocrine: Negative.    Genitourinary: Negative.    Musculoskeletal: Negative.    Skin: Positive for wound.   Allergic/Immunologic: Negative.    Neurological: Negative.    Hematological: Negative.    Psychiatric/Behavioral: Negative.          Objective:         ??? acetaminophen SR(+) (TYLENOL) 650 mg tablet Take one tablet by mouth every 6 hours as needed for Pain.   ??? aspirin EC 81 mg tablet Take 81 mg by mouth daily. Take with food.   ??? calcium carbonate-vitamin D3 600 mg(1,500mg ) -800 unit tab Take 1 Tab by mouth twice daily.   ??? Cholecalciferol (Vitamin D3) 2,000 unit cap Take 2,000 Units by mouth twice daily.   ??? doxycycline (VIBRAMYCIN) 100 mg tablet Take one tablet by mouth twice daily.   ??? everolimus (immunosuppressive) (ZORTRESS) 0.5 mg tablet Take 4 tablets by mouth twice daily.   ??? metoprolol tartrate (LOPRESSOR) 25 mg tablet Take 0.5 tablets by mouth twice daily.   ??? omeprazole DR(+) (PRILOSEC) 20 mg capsule Take 1 capsule by mouth twice daily.   ??? oxyCODONE (ROXICODONE, OXY-IR) 5 mg tablet Take one tablet by mouth every 4 hours as needed for Pain   ??? pravastatin (PRAVACHOL) 10 mg tablet Take 1 tablet by mouth at bedtime daily.   ??? prednisone (DELTASONE) 5 mg tablet Take one tablet by mouth daily with breakfast.   ??? senna/docusate (SENOKOT-S) 8.6/50 mg tablet Take one tablet by mouth daily.   ??? tacrolimus (PROGRAF) 1 mg capsule Take 1 mg by mouth twice daily.     Vitals:    11/22/16 1215   BP: 137/85   Pulse: 61   Resp: 16   Temp: 36.5 ???C (97.7 ???F)   TempSrc: Oral   SpO2: 98%   Weight: 89.1 kg (196 lb 6.4 oz)   Height: 167.6 cm (65.98)     Body mass index is 31.72 kg/m???.     Pain Score: Zero         Pain Addressed:  N/A    Patient Evaluated for a Clinical Trial: No treatment clinical trial available for this patient.     [place] Cooperative Oncology Group performance status is 0, Fully active, able to carry on all pre-disease performance without restriction.Marland Kitchen     Physical Exam   Constitutional: He is oriented to person, place, and time. He appears well-developed and well-nourished.   Eyes: Conjunctivae are normal. No scleral icterus.   Cardiovascular:  Normal rate and regular rhythm.    No murmur heard.  Pulmonary/Chest: Effort normal and breath sounds normal. No respiratory distress.   Neurological: He is alert and oriented to person, place, and time.   Skin:   Left third finger squamous cell cancer noted.   Psychiatric: He has a normal mood and affect. His behavior is normal. Judgment and thought content normal.             Assessment and Plan:  41 year old male with newly diagnosed left third finger squamous cell cancer    Plan for wide local excision of left third finger.  Risks, benefits, and alternatives were discussed with the patient and the patient is eager to proceed.

## 2016-11-26 NOTE — Telephone Encounter
Please return a call to [facility] regarding Moises's lab results.

## 2016-11-26 NOTE — Telephone Encounter
Returned call, advised pt did still have hematuria.  NC will discuss with Dr. Jeannine Kitten but pt will likely need urology referral.  Pt's wife reported pt tested positive for influenza B and will be treated with Tamiflu.

## 2016-11-28 ENCOUNTER — Encounter: Admit: 2016-11-28 | Discharge: 2016-11-28 | Payer: MEDICARE

## 2016-12-04 ENCOUNTER — Encounter: Admit: 2016-12-04 | Discharge: 2016-12-04 | Payer: MEDICARE

## 2016-12-04 MED ORDER — METOPROLOL TARTRATE 25 MG PO TAB
12.5 mg | ORAL_TABLET | Freq: Two times a day (BID) | ORAL | 1 refills | 90.00000 days | Status: AC
Start: 2016-12-04 — End: 2017-07-01

## 2016-12-04 MED ORDER — PRAVASTATIN 10 MG PO TAB
10 mg | ORAL_TABLET | Freq: Every evening | ORAL | 1 refills | 90.00000 days | Status: AC
Start: 2016-12-04 — End: 2017-07-01

## 2016-12-04 MED ORDER — OMEPRAZOLE 20 MG PO CPDR
20 mg | ORAL_CAPSULE | Freq: Two times a day (BID) | ORAL | 1 refills | Status: AC
Start: 2016-12-04 — End: 2017-07-01

## 2016-12-04 MED ORDER — PREDNISONE 5 MG PO TAB
5 mg | ORAL_TABLET | Freq: Every day | ORAL | 1 refills | Status: AC
Start: 2016-12-04 — End: 2017-02-06

## 2016-12-11 DIAGNOSIS — IMO0002 Squamous cell carcinoma: ICD-10-CM

## 2016-12-14 ENCOUNTER — Encounter: Admit: 2016-12-14 | Discharge: 2016-12-14 | Payer: MEDICARE

## 2016-12-14 MED ORDER — TACROLIMUS 1 MG PO CAP
1 mg | ORAL_CAPSULE | Freq: Two times a day (BID) | ORAL | 5 refills | 30.00000 days | Status: AC
Start: 2016-12-14 — End: 2017-02-06

## 2016-12-19 NOTE — Progress Notes
I spoke briefly with Ricky Rhodes who is scheduled for a skin cancer removal on his finger on 01/01/17 with Dr. Aundra Dubin. He tolerated anesthesia well with his last surgery on 09/03/16. Hx of liver transplant -- follows with transplant here. CBC and CMP 11/15/16 WNL. He denies chest pain, palpitations, SOA. No further PAC needs identified at this time.    Preoperative and medication instructions reviewed. Avoid NSAIDs 7 days before surgery. Okay to stay on everolimus per transplant team. NPO after 11 PM - water okay until two hours prior to check-in. Take tacrolimus, everolimus, metoprolol, omeprazole that morning. Check-in will be [facility] and he'll need a driver. Pt verbalized understanding, declined needing a copy of these instructions sent to him.

## 2016-12-21 ENCOUNTER — Encounter: Admit: 2016-12-21 | Discharge: 2016-12-21 | Payer: MEDICARE

## 2016-12-24 ENCOUNTER — Encounter: Admit: 2016-12-24 | Discharge: 2016-12-24 | Payer: MEDICARE

## 2016-12-28 ENCOUNTER — Encounter: Admit: 2016-12-28 | Discharge: 2016-12-28 | Payer: MEDICARE

## 2016-12-28 NOTE — Telephone Encounter
Patient is aware and agrees to appointment change

## 2017-01-02 ENCOUNTER — Encounter: Admit: 2017-01-02 | Discharge: 2017-01-02 | Payer: MEDICARE

## 2017-01-22 ENCOUNTER — Encounter: Admit: 2017-01-22 | Discharge: 2017-01-22 | Payer: MEDICARE

## 2017-01-31 ENCOUNTER — Ambulatory Visit: Admit: 2017-01-31 | Discharge: 2017-01-31 | Payer: MEDICARE

## 2017-02-01 ENCOUNTER — Ambulatory Visit: Admit: 2017-02-01 | Discharge: 2017-02-01 | Payer: MEDICARE

## 2017-02-01 ENCOUNTER — Encounter: Admit: 2017-02-01 | Discharge: 2017-02-01 | Payer: MEDICARE

## 2017-02-01 DIAGNOSIS — Z944 Liver transplant status: ICD-10-CM

## 2017-02-01 DIAGNOSIS — K7460 Unspecified cirrhosis of liver: ICD-10-CM

## 2017-02-01 DIAGNOSIS — K754 Autoimmune hepatitis: ICD-10-CM

## 2017-02-01 DIAGNOSIS — C4492 Squamous cell carcinoma of skin, unspecified: ICD-10-CM

## 2017-02-01 DIAGNOSIS — C801 Malignant (primary) neoplasm, unspecified: ICD-10-CM

## 2017-02-01 DIAGNOSIS — T148XXA Other injury of unspecified body region, initial encounter: ICD-10-CM

## 2017-02-01 DIAGNOSIS — Z85828 Personal history of other malignant neoplasm of skin: ICD-10-CM

## 2017-02-01 DIAGNOSIS — A0472 Enterocolitis due to Clostridium difficile, not specified as recurrent: ICD-10-CM

## 2017-02-01 DIAGNOSIS — N200 Calculus of kidney: ICD-10-CM

## 2017-02-01 DIAGNOSIS — C44629 Squamous cell carcinoma of skin of left upper limb, including shoulder: Principal | ICD-10-CM

## 2017-02-01 DIAGNOSIS — K769 Liver disease, unspecified: ICD-10-CM

## 2017-02-01 DIAGNOSIS — I8500 Esophageal varices without bleeding: ICD-10-CM

## 2017-02-01 DIAGNOSIS — IMO0002 Squamous cell carcinoma: ICD-10-CM

## 2017-02-01 MED ORDER — DEXMEDETOMIDINE# 4MCG/ML IV SOLN
0 refills | Status: DC
Start: 2017-02-01 — End: 2017-02-01
  Administered 2017-02-01: 15:00:00 6 ug via INTRAVENOUS
  Administered 2017-02-01: 14:00:00 8 ug via INTRAVENOUS
  Administered 2017-02-01: 14:00:00 6 ug via INTRAVENOUS

## 2017-02-01 MED ORDER — BUPIVACAINE 0.25 % (2.5 MG/ML) IJ SOLN
0 refills | Status: DC
Start: 2017-02-01 — End: 2017-02-01
  Administered 2017-02-01: 15:00:00 5.5 mL via INTRAMUSCULAR

## 2017-02-01 MED ORDER — FENTANYL CITRATE (PF) 50 MCG/ML IJ SOLN
0 refills | Status: DC
Start: 2017-02-01 — End: 2017-02-01
  Administered 2017-02-01 (×2): 25 ug via INTRAVENOUS
  Administered 2017-02-01: 14:00:00 50 ug via INTRAVENOUS

## 2017-02-01 MED ORDER — CEFAZOLIN 1 GRAM IJ SOLR
0 refills | Status: DC
Start: 2017-02-01 — End: 2017-02-01
  Administered 2017-02-01: 14:00:00 2 g via INTRAVENOUS

## 2017-02-01 MED ORDER — SODIUM CHLORIDE 0.9 % IV SOLP
1000 mL | INTRAVENOUS | 0 refills | Status: DC
Start: 2017-02-01 — End: 2017-02-01
  Administered 2017-02-01: 13:00:00 1000 mL via INTRAVENOUS

## 2017-02-01 MED ORDER — LIDOCAINE (PF) 200 MG/10 ML (2 %) IJ SYRG
0 refills | Status: DC
Start: 2017-02-01 — End: 2017-02-01
  Administered 2017-02-01: 14:00:00 60 mg via INTRAVENOUS

## 2017-02-01 MED ORDER — OXYCODONE 5 MG PO TAB
5-10 mg | Freq: Once | ORAL | 0 refills | Status: CP | PRN
Start: 2017-02-01 — End: ?
  Administered 2017-02-01: 16:00:00 10 mg via ORAL

## 2017-02-01 MED ORDER — ONDANSETRON HCL (PF) 4 MG/2 ML IJ SOLN
4 mg | Freq: Once | INTRAVENOUS | 0 refills | Status: DC | PRN
Start: 2017-02-01 — End: 2017-02-01

## 2017-02-01 MED ORDER — PROPOFOL 10 MG/ML IV EMUL 50 ML (INFUSION)(AM)(OR)
INTRAVENOUS | 0 refills | Status: DC
Start: 2017-02-01 — End: 2017-02-01
  Administered 2017-02-01: 14:00:00 100 ug/kg/min via INTRAVENOUS

## 2017-02-01 MED ORDER — OXYCODONE-ACETAMINOPHEN 5-325 MG PO TAB
1 | ORAL_TABLET | ORAL | 0 refills | 2.00000 days | Status: AC | PRN
Start: 2017-02-01 — End: 2017-07-16

## 2017-02-01 MED ORDER — PROPOFOL INJ 10 MG/ML IV VIAL
0 refills | Status: DC
Start: 2017-02-01 — End: 2017-02-01
  Administered 2017-02-01: 15:00:00 20 mg via INTRAVENOUS
  Administered 2017-02-01: 14:00:00 40 mg via INTRAVENOUS
  Administered 2017-02-01 (×7): 20 mg via INTRAVENOUS

## 2017-02-01 MED ORDER — LIDOCAINE (PF) 10 MG/ML (1 %) IJ SOLN
.1-2 mL | INTRAMUSCULAR | 0 refills | Status: DC | PRN
Start: 2017-02-01 — End: 2017-02-01

## 2017-02-01 MED ORDER — BACITRACIN ZINC 500 UNIT/GRAM TP OINT
Freq: Every day | TOPICAL | 0 refills | 14.00000 days | Status: AC
Start: 2017-02-01 — End: ?

## 2017-02-01 MED ORDER — LIDOCAINE HCL 10 MG/ML (1 %) IJ SOLN
0 refills | Status: DC
Start: 2017-02-01 — End: 2017-02-01
  Administered 2017-02-01: 15:00:00 5.5 mL via INTRAMUSCULAR

## 2017-02-01 MED ORDER — BACITRACIN ZINC 500 UNIT/GRAM TP OINT
0 refills | Status: DC
Start: 2017-02-01 — End: 2017-02-01
  Administered 2017-02-01: 15:00:00 1 via TOPICAL

## 2017-02-01 MED ORDER — METOCLOPRAMIDE HCL 5 MG/ML IJ SOLN
10 mg | Freq: Once | INTRAVENOUS | 0 refills | Status: DC | PRN
Start: 2017-02-01 — End: 2017-02-01

## 2017-02-01 MED ORDER — FENTANYL CITRATE (PF) 50 MCG/ML IJ SOLN
50 ug | INTRAVENOUS | 0 refills | Status: DC | PRN
Start: 2017-02-01 — End: 2017-02-01

## 2017-02-01 NOTE — Progress Notes
Discharge instructions discussed with patient and spouse. Discussed in detail signs and symptoms, daily care recommendations and limitations, pain management, and follow up appointment. Prescriptions provided. All parties verbalized understanding, verbalized via teach back method. All questions and concerns addressed. Patient approved for discharge by Dr. Donnie Coffin.   Patient signed out from anesthesia by Dr. Fatima Sanger.

## 2017-02-01 NOTE — Other
Brief Operative Note    Name: Ricky Rhodes is a 42 y.o. male     DOB: [birth year]             MRN#: [id number]  DATE OF OPERATION: 02/01/2017    Date:  02/01/2017        Preoperative Dx:   SCC (squamous cell carcinoma) [C44.92]    Post-op Diagnosis      * SCC (squamous cell carcinoma) [C44.92]    Procedure(s) (LRB):  WIDE LOCAL EXCISION OF LEFT THIRD FINGER (Left)    Anesthesia Type: Monitored Anesthesia Care (MAC)    Surgeon(s) and Role:     * Cathlean Marseilles M.V., MD - Primary     Jarome Lamas, MD - Resident - Assisting      Findings:  3cm diameter of left third finger    Estimated Blood Loss: 65m    Specimen(s) Removed/Disposition:   ID Type Source Tests Collected by Time Destination   1 : 1. Wide Local Excision of Left Third Finger Tissue Finger SURGICAL PATHOLOGY          MCathlean MarseillesM.V., MD 02/01/2017 0827    2 : 2. Additonal Proximal Margin of Left Third Finger Tissue Finger SURGICAL PATHOLOGY          MCathlean MarseillesM.V., MD 02/01/2017 [id number]   3 : 3. Additional Distal Margin of Left Third Finger Tissue Finger SURGICAL PATHOLOGY          MCathlean MarseillesM.V., MD 02/01/2017 04431       Complications:  None    Implants: None    Drains: None    Disposition:  PACU - stable    JCathlean Marseilles MD  Pager [phone]

## 2017-02-05 ENCOUNTER — Encounter: Admit: 2017-02-05 | Discharge: 2017-02-05 | Payer: MEDICARE

## 2017-02-05 DIAGNOSIS — IMO0002 Squamous cell carcinoma: ICD-10-CM

## 2017-02-05 DIAGNOSIS — C801 Malignant (primary) neoplasm, unspecified: ICD-10-CM

## 2017-02-05 DIAGNOSIS — N200 Calculus of kidney: ICD-10-CM

## 2017-02-05 DIAGNOSIS — K7460 Unspecified cirrhosis of liver: ICD-10-CM

## 2017-02-05 DIAGNOSIS — T148XXA Other injury of unspecified body region, initial encounter: ICD-10-CM

## 2017-02-05 DIAGNOSIS — I8500 Esophageal varices without bleeding: ICD-10-CM

## 2017-02-05 DIAGNOSIS — A0472 Enterocolitis due to Clostridium difficile, not specified as recurrent: ICD-10-CM

## 2017-02-05 DIAGNOSIS — K769 Liver disease, unspecified: ICD-10-CM

## 2017-02-05 DIAGNOSIS — K754 Autoimmune hepatitis: Principal | ICD-10-CM

## 2017-02-05 DIAGNOSIS — Z944 Liver transplant status: ICD-10-CM

## 2017-02-06 ENCOUNTER — Encounter: Admit: 2017-02-06 | Discharge: 2017-02-06 | Payer: MEDICARE

## 2017-02-06 MED ORDER — EVEROLIMUS (IMMUNOSUPPRESSIVE) 0.5 MG PO TAB
2 mg | ORAL_TABLET | Freq: Two times a day (BID) | ORAL | 1 refills | 30.00000 days | Status: AC
Start: 2017-02-06 — End: 2017-04-24

## 2017-02-06 MED ORDER — PREDNISONE 5 MG PO TAB
5 mg | ORAL_TABLET | Freq: Every day | ORAL | 1 refills | Status: AC
Start: 2017-02-06 — End: 2017-07-01

## 2017-02-06 MED ORDER — TACROLIMUS 1 MG PO CAP
1 mg | ORAL_CAPSULE | Freq: Two times a day (BID) | ORAL | 1 refills | 30.00000 days | Status: AC
Start: 2017-02-06 — End: 2017-07-01

## 2017-02-11 ENCOUNTER — Encounter: Admit: 2017-02-11 | Discharge: 2017-02-11 | Payer: MEDICARE

## 2017-02-15 ENCOUNTER — Encounter: Admit: 2017-02-15 | Discharge: 2017-02-15 | Payer: MEDICARE

## 2017-02-15 DIAGNOSIS — IMO0002 Squamous cell carcinoma: ICD-10-CM

## 2017-02-15 DIAGNOSIS — T148XXA Other injury of unspecified body region, initial encounter: ICD-10-CM

## 2017-02-15 DIAGNOSIS — K754 Autoimmune hepatitis: Principal | ICD-10-CM

## 2017-02-15 DIAGNOSIS — Z944 Liver transplant status: ICD-10-CM

## 2017-02-15 DIAGNOSIS — N200 Calculus of kidney: ICD-10-CM

## 2017-02-15 DIAGNOSIS — K769 Liver disease, unspecified: ICD-10-CM

## 2017-02-15 DIAGNOSIS — C801 Malignant (primary) neoplasm, unspecified: ICD-10-CM

## 2017-02-15 DIAGNOSIS — K7460 Unspecified cirrhosis of liver: ICD-10-CM

## 2017-02-15 DIAGNOSIS — Z85828 Personal history of other malignant neoplasm of skin: ICD-10-CM

## 2017-02-15 DIAGNOSIS — Z08 Encounter for follow-up examination after completed treatment for malignant neoplasm: Principal | ICD-10-CM

## 2017-02-15 DIAGNOSIS — I8500 Esophageal varices without bleeding: ICD-10-CM

## 2017-02-15 DIAGNOSIS — A0472 Enterocolitis due to Clostridium difficile, not specified as recurrent: ICD-10-CM

## 2017-02-18 ENCOUNTER — Encounter: Admit: 2017-02-18 | Discharge: 2017-02-18 | Payer: MEDICARE

## 2017-02-18 DIAGNOSIS — IMO0002 Squamous cell carcinoma: ICD-10-CM

## 2017-02-18 DIAGNOSIS — A0472 Enterocolitis due to Clostridium difficile, not specified as recurrent: ICD-10-CM

## 2017-02-18 DIAGNOSIS — K7460 Unspecified cirrhosis of liver: ICD-10-CM

## 2017-02-18 DIAGNOSIS — K754 Autoimmune hepatitis: Principal | ICD-10-CM

## 2017-02-18 DIAGNOSIS — K769 Liver disease, unspecified: ICD-10-CM

## 2017-02-18 DIAGNOSIS — I8500 Esophageal varices without bleeding: ICD-10-CM

## 2017-02-18 DIAGNOSIS — C801 Malignant (primary) neoplasm, unspecified: ICD-10-CM

## 2017-02-18 DIAGNOSIS — N200 Calculus of kidney: ICD-10-CM

## 2017-02-18 DIAGNOSIS — Z944 Liver transplant status: ICD-10-CM

## 2017-02-18 DIAGNOSIS — T148XXA Other injury of unspecified body region, initial encounter: ICD-10-CM

## 2017-02-25 ENCOUNTER — Encounter: Admit: 2017-02-25 | Discharge: 2017-02-25 | Payer: MEDICARE

## 2017-03-04 ENCOUNTER — Encounter: Admit: 2017-03-04 | Discharge: 2017-03-04 | Payer: MEDICARE

## 2017-03-07 ENCOUNTER — Encounter: Admit: 2017-03-07 | Discharge: 2017-03-07 | Payer: MEDICARE

## 2017-03-07 DIAGNOSIS — Z79899 Other long term (current) drug therapy: ICD-10-CM

## 2017-03-07 DIAGNOSIS — Z944 Liver transplant status: Principal | ICD-10-CM

## 2017-03-07 LAB — COMPREHENSIVE METABOLIC PANEL
Lab: 0.9 mg/dL
Lab: 106 mmol/L
Lab: 13 mmol/L
Lab: 140 mmol/L
Lab: 24 mmol/L
Lab: 4 mmol/L
Lab: 85 mL/min

## 2017-03-07 LAB — GGTP: Lab: 50 U/L (ref 15–73)

## 2017-03-11 ENCOUNTER — Encounter: Admit: 2017-03-11 | Discharge: 2017-03-11 | Payer: MEDICARE

## 2017-03-11 DIAGNOSIS — Z79899 Other long term (current) drug therapy: ICD-10-CM

## 2017-03-11 DIAGNOSIS — Z944 Liver transplant status: Principal | ICD-10-CM

## 2017-03-11 LAB — TACROLIMUS IMMUNOASSAY (FK506): Lab: 1.2 ng/mL — ABNORMAL LOW (ref 2.0–20.0)

## 2017-03-11 LAB — EVEROLIMUS BLOOD: Lab: 4.1 ng/mL (ref 3.0–8.0)

## 2017-03-15 ENCOUNTER — Encounter: Admit: 2017-03-15 | Discharge: 2017-03-15 | Payer: MEDICARE

## 2017-04-02 ENCOUNTER — Encounter: Admit: 2017-04-02 | Discharge: 2017-04-02 | Payer: MEDICARE

## 2017-04-02 DIAGNOSIS — Z944 Liver transplant status: Principal | ICD-10-CM

## 2017-04-02 DIAGNOSIS — Z79899 Other long term (current) drug therapy: ICD-10-CM

## 2017-04-02 LAB — COMPREHENSIVE METABOLIC PANEL
Lab: 3.8
Lab: 77
Lab: 0.5 mg/dL
Lab: 108
Lab: 140 mmol/l
Lab: 36
Lab: 51 — ABNORMAL HIGH (ref 14–50)
Lab: 7.2

## 2017-04-02 LAB — GGTP: Lab: 39

## 2017-04-02 LAB — CBC AND DIFF: Lab: 8.4

## 2017-04-04 ENCOUNTER — Encounter: Admit: 2017-04-04 | Discharge: 2017-04-04 | Payer: MEDICARE

## 2017-04-04 DIAGNOSIS — Z944 Liver transplant status: Principal | ICD-10-CM

## 2017-04-04 DIAGNOSIS — Z79899 Other long term (current) drug therapy: ICD-10-CM

## 2017-04-04 LAB — EVEROLIMUS BLOOD: Lab: 7.1 g/dL (ref 32.0–36.0)

## 2017-04-04 LAB — TACROLIMUS IMMUNOASSAY (FK506): Lab: 3.3 pg (ref 26–34)

## 2017-04-15 ENCOUNTER — Encounter: Admit: 2017-04-15 | Discharge: 2017-04-15 | Payer: MEDICARE

## 2017-04-18 ENCOUNTER — Encounter: Admit: 2017-04-18 | Discharge: 2017-04-18 | Payer: MEDICARE

## 2017-04-18 DIAGNOSIS — K7460 Unspecified cirrhosis of liver: ICD-10-CM

## 2017-04-18 DIAGNOSIS — I8500 Esophageal varices without bleeding: ICD-10-CM

## 2017-04-18 DIAGNOSIS — N200 Calculus of kidney: ICD-10-CM

## 2017-04-18 DIAGNOSIS — A0472 Enterocolitis due to Clostridium difficile, not specified as recurrent: ICD-10-CM

## 2017-04-18 DIAGNOSIS — IMO0002 Squamous cell carcinoma: ICD-10-CM

## 2017-04-18 DIAGNOSIS — C801 Malignant (primary) neoplasm, unspecified: ICD-10-CM

## 2017-04-18 DIAGNOSIS — K769 Liver disease, unspecified: ICD-10-CM

## 2017-04-18 DIAGNOSIS — Z944 Liver transplant status: ICD-10-CM

## 2017-04-18 DIAGNOSIS — K754 Autoimmune hepatitis: Principal | ICD-10-CM

## 2017-04-18 DIAGNOSIS — T148XXA Other injury of unspecified body region, initial encounter: ICD-10-CM

## 2017-04-22 ENCOUNTER — Encounter: Admit: 2017-04-22 | Discharge: 2017-04-22 | Payer: MEDICARE

## 2017-04-22 DIAGNOSIS — M899 Disorder of bone, unspecified: ICD-10-CM

## 2017-04-22 DIAGNOSIS — M8580 Other specified disorders of bone density and structure, unspecified site: Principal | ICD-10-CM

## 2017-04-22 DIAGNOSIS — Z79899 Other long term (current) drug therapy: ICD-10-CM

## 2017-04-24 ENCOUNTER — Encounter: Admit: 2017-04-24 | Discharge: 2017-04-24 | Payer: MEDICARE

## 2017-04-26 ENCOUNTER — Encounter: Admit: 2017-04-26 | Discharge: 2017-04-26 | Payer: MEDICARE

## 2017-04-30 ENCOUNTER — Encounter: Admit: 2017-04-30 | Discharge: 2017-04-30 | Payer: MEDICARE

## 2017-05-03 ENCOUNTER — Encounter: Admit: 2017-05-03 | Discharge: 2017-05-03 | Payer: MEDICARE

## 2017-05-09 ENCOUNTER — Encounter: Admit: 2017-05-09 | Discharge: 2017-05-09 | Payer: MEDICARE

## 2017-06-28 ENCOUNTER — Encounter: Admit: 2017-06-28 | Discharge: 2017-06-28 | Payer: MEDICARE

## 2017-06-28 DIAGNOSIS — Z944 Liver transplant status: Principal | ICD-10-CM

## 2017-06-28 DIAGNOSIS — Z79899 Other long term (current) drug therapy: ICD-10-CM

## 2017-06-28 LAB — GGTP: Lab: 45 U/L (ref 15–73)

## 2017-06-28 LAB — TACROLIMUS IMMUNOASSAY (FK506): Lab: 2.1 ng/mL (ref 2.0–20.0)

## 2017-06-28 LAB — COMPREHENSIVE METABOLIC PANEL
Lab: 0.9 mg/dL — ABNORMAL HIGH (ref 0.80–1.50)
Lab: 12 mmol/L — ABNORMAL LOW (ref 6.0–18.0)
Lab: 141 mmol/L (ref 137–145)
Lab: 23 mmol/L — ABNORMAL LOW (ref 22–30)
Lab: 31 U/L (ref 21–72)
Lab: 50 U/L — ABNORMAL HIGH (ref 14–50)
Lab: 7 g/dL — ABNORMAL HIGH (ref 6.3–8.2)
Lab: 8.6 mg/dL (ref 8.0–10.2)
Lab: 81 mg/dL (ref 75–110)

## 2017-06-28 LAB — CBC AND DIFF
Lab: 0.4
Lab: 0.2 10*3/uL — ABNORMAL HIGH (ref 0.02–0.1)
Lab: 1.2 10*3/uL — ABNORMAL HIGH (ref 0.2–1.0)
Lab: 13 g/dL — ABNORMAL LOW (ref 14.0–18.0)
Lab: 2.5 10^3/uL (ref 1.5–4.0)
Lab: 222 K/mm3 (ref 150–400)
Lab: 27 % (ref 20.0–40.0)
Lab: 31 g/dL — ABNORMAL LOW (ref 32.0–36.0)
Lab: 4.7 (ref 3.5–5.0)
Lab: 9 10*3/uL (ref 4.8–10.8)

## 2017-06-28 LAB — EVEROLIMUS BLOOD: Lab: 9.1 ng/mL — ABNORMAL HIGH (ref 3.0–8.0)

## 2017-07-01 ENCOUNTER — Encounter: Admit: 2017-07-01 | Discharge: 2017-07-01 | Payer: MEDICARE

## 2017-07-01 ENCOUNTER — Emergency Department: Admit: 2017-07-01 | Discharge: 2017-07-02 | Disposition: A | Discharge status: Home / Self Care | Payer: MEDICARE

## 2017-07-01 ENCOUNTER — Emergency Department: Admit: 2017-07-01 | Discharge: 2017-07-02 | Payer: MEDICARE

## 2017-07-01 DIAGNOSIS — I8500 Esophageal varices without bleeding: ICD-10-CM

## 2017-07-01 DIAGNOSIS — K769 Liver disease, unspecified: ICD-10-CM

## 2017-07-01 DIAGNOSIS — K754 Autoimmune hepatitis: Principal | ICD-10-CM

## 2017-07-01 DIAGNOSIS — Z944 Liver transplant status: ICD-10-CM

## 2017-07-01 DIAGNOSIS — C801 Malignant (primary) neoplasm, unspecified: ICD-10-CM

## 2017-07-01 DIAGNOSIS — A0472 Enterocolitis due to Clostridium difficile, not specified as recurrent: ICD-10-CM

## 2017-07-01 DIAGNOSIS — T148XXA Other injury of unspecified body region, initial encounter: ICD-10-CM

## 2017-07-01 DIAGNOSIS — N200 Calculus of kidney: ICD-10-CM

## 2017-07-01 DIAGNOSIS — R110 Nausea: ICD-10-CM

## 2017-07-01 DIAGNOSIS — K7460 Unspecified cirrhosis of liver: ICD-10-CM

## 2017-07-01 DIAGNOSIS — IMO0002 Squamous cell carcinoma: ICD-10-CM

## 2017-07-01 LAB — CBC AND DIFF
Lab: 0 % (ref 0–2)
Lab: 0 10*3/uL (ref 0–0.20)
Lab: 0 10*3/uL (ref 0–0.45)
Lab: 0.7 10*3/uL (ref 0–0.80)
Lab: 1 % (ref 0–5)
Lab: 1.3 10*3/uL (ref 1.0–4.8)
Lab: 15 % — ABNORMAL LOW (ref 24–44)
Lab: 15 g/dL (ref 13.5–16.5)
Lab: 16 % — ABNORMAL HIGH (ref 11–15)
Lab: 236 10*3/uL (ref 150–400)
Lab: 26 pg (ref 26–34)
Lab: 33 g/dL — ABNORMAL HIGH (ref 32.0–36.0)
Lab: 6 M/UL — ABNORMAL HIGH (ref 4.4–5.5)
Lab: 6.6 10*3/uL (ref 1.8–7.0)
Lab: 76 % — ABNORMAL HIGH (ref >=60)
Lab: 79 FL — ABNORMAL LOW (ref 80–100)
Lab: 8 % (ref 4–12)
Lab: 8.7 K/UL (ref 4.5–11.0)
Lab: 9.3 FL — ABNORMAL HIGH (ref >=60)

## 2017-07-01 LAB — COMPREHENSIVE METABOLIC PANEL
Lab: 1 mg/dL (ref 0.4–1.24)
Lab: 107 MMOL/L (ref 98–110)
Lab: 122 mg/dL — ABNORMAL HIGH (ref 70–100)
Lab: 138 MMOL/L (ref 137–147)
Lab: 15 mg/dL (ref 7–25)
Lab: 4 MMOL/L (ref 3.5–5.1)
Lab: 4 g/dL — ABNORMAL HIGH (ref 3.5–5.0)
Lab: 7.6 g/dL (ref 6.0–8.0)
Lab: 9.5 mg/dL (ref 8.5–10.6)

## 2017-07-01 LAB — URINALYSIS DIPSTICK
Lab: NEGATIVE
Lab: NEGATIVE
Lab: NEGATIVE
Lab: NEGATIVE
Lab: NEGATIVE
Lab: POSITIVE — AB

## 2017-07-01 LAB — URINALYSIS, MICROSCOPIC

## 2017-07-01 LAB — POC LACTATE: Lab: 0.8 MMOL/L (ref 0.5–2.0)

## 2017-07-01 LAB — POC CREATININE, RAD: Lab: 1.1 mg/dL (ref 0.4–1.24)

## 2017-07-01 MED ORDER — FENTANYL CITRATE (PF) 50 MCG/ML IJ SOLN
50 ug | Freq: Once | INTRAVENOUS | 0 refills | Status: DC
Start: 2017-07-01 — End: 2017-07-02

## 2017-07-01 MED ORDER — OMEPRAZOLE 20 MG PO CPDR
20 mg | ORAL_CAPSULE | Freq: Two times a day (BID) | ORAL | 1 refills | Status: AC
Start: 2017-07-01 — End: 2018-01-14

## 2017-07-01 MED ORDER — PREDNISONE 5 MG PO TAB
5 mg | ORAL_TABLET | Freq: Every day | ORAL | 1 refills | Status: AC
Start: 2017-07-01 — End: 2018-02-11

## 2017-07-01 MED ORDER — MORPHINE 4 MG/ML IV CRTG
4 mg | Freq: Once | INTRAVENOUS | 0 refills | Status: CP
Start: 2017-07-01 — End: ?
  Administered 2017-07-02: 01:00:00 4 mg via INTRAVENOUS

## 2017-07-01 MED ORDER — TACROLIMUS 1 MG PO CAP
1 mg | ORAL_CAPSULE | Freq: Two times a day (BID) | ORAL | 1 refills | 30.00000 days | Status: AC
Start: 2017-07-01 — End: 2018-02-19

## 2017-07-01 MED ORDER — METOPROLOL TARTRATE 25 MG PO TAB
12.5 mg | ORAL_TABLET | Freq: Two times a day (BID) | ORAL | 1 refills | 90.00000 days | Status: AC
Start: 2017-07-01 — End: 2018-02-11

## 2017-07-01 MED ORDER — PRAVASTATIN 10 MG PO TAB
10 mg | ORAL_TABLET | Freq: Every evening | ORAL | 1 refills | 90.00000 days | Status: AC
Start: 2017-07-01 — End: 2018-02-11

## 2017-07-01 MED ORDER — IOHEXOL 350 MG IODINE/ML IV SOLN
100 mL | Freq: Once | INTRAVENOUS | 0 refills | Status: CP
Start: 2017-07-01 — End: ?
  Administered 2017-07-01: 100 mL via INTRAVENOUS

## 2017-07-01 MED ORDER — OXYCODONE 5 MG PO TAB
5 mg | ORAL_TABLET | ORAL | 0 refills | 6.00000 days | Status: AC | PRN
Start: 2017-07-01 — End: 2017-07-16

## 2017-07-01 MED ORDER — SODIUM CHLORIDE 0.9 % IJ SOLN
50 mL | Freq: Once | INTRAVENOUS | 0 refills | Status: CP
Start: 2017-07-01 — End: ?
  Administered 2017-07-01: 50 mL via INTRAVENOUS

## 2017-07-02 DIAGNOSIS — Z944 Liver transplant status: ICD-10-CM

## 2017-07-02 DIAGNOSIS — R1031 Right lower quadrant pain: Principal | ICD-10-CM

## 2017-07-02 DIAGNOSIS — R030 Elevated blood-pressure reading, without diagnosis of hypertension: ICD-10-CM

## 2017-07-16 ENCOUNTER — Ambulatory Visit: Admit: 2017-07-16 | Discharge: 2017-07-16 | Payer: MEDICARE

## 2017-07-16 ENCOUNTER — Encounter: Admit: 2017-07-16 | Discharge: 2017-07-16 | Payer: MEDICARE

## 2017-07-16 DIAGNOSIS — M8580 Other specified disorders of bone density and structure, unspecified site: ICD-10-CM

## 2017-07-16 DIAGNOSIS — N200 Calculus of kidney: ICD-10-CM

## 2017-07-16 DIAGNOSIS — K754 Autoimmune hepatitis: Principal | ICD-10-CM

## 2017-07-16 DIAGNOSIS — R945 Abnormal results of liver function studies: ICD-10-CM

## 2017-07-16 DIAGNOSIS — M899 Disorder of bone, unspecified: Principal | ICD-10-CM

## 2017-07-16 DIAGNOSIS — Z944 Liver transplant status: ICD-10-CM

## 2017-07-16 DIAGNOSIS — K7460 Unspecified cirrhosis of liver: ICD-10-CM

## 2017-07-16 DIAGNOSIS — K769 Liver disease, unspecified: ICD-10-CM

## 2017-07-16 DIAGNOSIS — IMO0002 Squamous cell carcinoma: ICD-10-CM

## 2017-07-16 DIAGNOSIS — C801 Malignant (primary) neoplasm, unspecified: ICD-10-CM

## 2017-07-16 DIAGNOSIS — T148XXA Other injury of unspecified body region, initial encounter: ICD-10-CM

## 2017-07-16 DIAGNOSIS — I8500 Esophageal varices without bleeding: ICD-10-CM

## 2017-07-16 DIAGNOSIS — Z4889 Encounter for other specified surgical aftercare: ICD-10-CM

## 2017-07-16 DIAGNOSIS — Z79899 Other long term (current) drug therapy: ICD-10-CM

## 2017-07-16 DIAGNOSIS — A0472 Enterocolitis due to Clostridium difficile, not specified as recurrent: ICD-10-CM

## 2017-07-19 ENCOUNTER — Encounter: Admit: 2017-07-19 | Discharge: 2017-07-19 | Payer: MEDICARE

## 2017-07-19 DIAGNOSIS — Z4889 Encounter for other specified surgical aftercare: Principal | ICD-10-CM

## 2017-07-19 DIAGNOSIS — A0472 Enterocolitis due to Clostridium difficile, not specified as recurrent: ICD-10-CM

## 2017-07-19 DIAGNOSIS — I8500 Esophageal varices without bleeding: ICD-10-CM

## 2017-07-19 DIAGNOSIS — Z944 Liver transplant status: ICD-10-CM

## 2017-07-19 DIAGNOSIS — K754 Autoimmune hepatitis: Principal | ICD-10-CM

## 2017-07-19 DIAGNOSIS — N200 Calculus of kidney: ICD-10-CM

## 2017-07-19 DIAGNOSIS — K7460 Unspecified cirrhosis of liver: ICD-10-CM

## 2017-07-19 DIAGNOSIS — IMO0002 Squamous cell carcinoma: ICD-10-CM

## 2017-07-19 DIAGNOSIS — Z79899 Other long term (current) drug therapy: ICD-10-CM

## 2017-07-19 DIAGNOSIS — T148XXA Other injury of unspecified body region, initial encounter: ICD-10-CM

## 2017-07-19 DIAGNOSIS — K769 Liver disease, unspecified: ICD-10-CM

## 2017-07-19 DIAGNOSIS — C801 Malignant (primary) neoplasm, unspecified: ICD-10-CM

## 2017-08-16 ENCOUNTER — Encounter: Admit: 2017-08-16 | Discharge: 2017-08-16 | Payer: MEDICARE

## 2017-09-30 ENCOUNTER — Encounter: Admit: 2017-09-30 | Discharge: 2017-09-30 | Payer: MEDICARE

## 2017-09-30 DIAGNOSIS — Z79899 Other long term (current) drug therapy: ICD-10-CM

## 2017-09-30 DIAGNOSIS — Z944 Liver transplant status: Principal | ICD-10-CM

## 2017-09-30 LAB — COMPREHENSIVE METABOLIC PANEL
Lab: 26
Lab: 3.3 — ABNORMAL LOW (ref 3.5–5.0)
Lab: 3.8
Lab: 7.2
Lab: 77
Lab: 8.7
Lab: 0.5
Lab: 1
Lab: 10
Lab: 108
Lab: 141
Lab: 47
Lab: 76 — ABNORMAL HIGH (ref 14–50)
Lab: 90

## 2017-09-30 LAB — CBC AND DIFF
Lab: 0.4
Lab: 11 — ABNORMAL HIGH (ref 2.0–10.0)
Lab: 14
Lab: 15 — ABNORMAL HIGH (ref 11.5–14.5)
Lab: 26 — ABNORMAL LOW (ref 27.0–31.0)
Lab: 43
Lab: 58
Lab: 81
Lab: 0.1
Lab: 1.4 — ABNORMAL HIGH (ref 0.0–1.0)
Lab: 10
Lab: 2.2
Lab: 23
Lab: 252
Lab: 33
Lab: 4.5
Lab: 5.3 — AB
Lab: 9.3

## 2017-09-30 LAB — TACROLIMUS IMMUNOASSAY (FK506): Lab: 1.1 — ABNORMAL LOW (ref 2.0–20.0)

## 2017-09-30 LAB — GGTP: Lab: 56

## 2017-09-30 LAB — EVEROLIMUS BLOOD: Lab: 7.4

## 2017-10-01 ENCOUNTER — Encounter: Admit: 2017-10-01 | Discharge: 2017-10-01 | Payer: MEDICARE

## 2017-10-01 MED ORDER — EVEROLIMUS (IMMUNOSUPPRESSIVE) 0.5 MG PO TAB
2 mg | ORAL_TABLET | Freq: Two times a day (BID) | ORAL | 5 refills | 30.00000 days | Status: AC
Start: 2017-10-01 — End: ?

## 2017-10-08 ENCOUNTER — Encounter: Admit: 2017-10-08 | Discharge: 2017-10-08 | Payer: MEDICARE

## 2017-12-06 ENCOUNTER — Encounter: Admit: 2017-12-06 | Discharge: 2017-12-06 | Payer: MEDICARE

## 2017-12-09 ENCOUNTER — Encounter: Admit: 2017-12-09 | Discharge: 2017-12-09 | Payer: MEDICARE

## 2017-12-10 ENCOUNTER — Encounter: Admit: 2017-12-10 | Discharge: 2017-12-10 | Payer: MEDICARE

## 2018-01-13 ENCOUNTER — Encounter: Admit: 2018-01-13 | Discharge: 2018-01-13 | Payer: MEDICARE

## 2018-01-14 MED ORDER — OMEPRAZOLE 20 MG PO CPDR
20 mg | ORAL_CAPSULE | Freq: Every day | ORAL | 1 refills | Status: AC
Start: 2018-01-14 — End: 2018-08-13

## 2018-02-10 ENCOUNTER — Encounter: Admit: 2018-02-10 | Discharge: 2018-02-10 | Payer: MEDICARE

## 2018-02-11 MED ORDER — METOPROLOL TARTRATE 25 MG PO TAB
12.5 mg | ORAL_TABLET | Freq: Two times a day (BID) | ORAL | 1 refills | 90.00000 days | Status: AC
Start: 2018-02-11 — End: 2018-08-13

## 2018-02-11 MED ORDER — PRAVASTATIN 10 MG PO TAB
10 mg | ORAL_TABLET | Freq: Every evening | ORAL | 1 refills | 90.00000 days | Status: AC
Start: 2018-02-11 — End: 2018-08-13

## 2018-02-11 MED ORDER — PREDNISONE 5 MG PO TAB
5 mg | ORAL_TABLET | Freq: Every day | ORAL | 1 refills | Status: AC
Start: 2018-02-11 — End: 2019-01-19

## 2018-02-19 ENCOUNTER — Encounter: Admit: 2018-02-19 | Discharge: 2018-02-19 | Payer: MEDICARE

## 2018-02-19 MED ORDER — TACROLIMUS 1 MG PO CAP
1 mg | ORAL_CAPSULE | Freq: Two times a day (BID) | ORAL | 1 refills | 30.00000 days | Status: AC
Start: 2018-02-19 — End: 2018-07-01

## 2018-02-24 ENCOUNTER — Encounter: Admit: 2018-02-24 | Discharge: 2018-02-24 | Payer: MEDICARE

## 2018-02-27 ENCOUNTER — Encounter: Admit: 2018-02-27 | Discharge: 2018-02-27 | Payer: MEDICARE

## 2018-02-27 DIAGNOSIS — Z944 Liver transplant status: Principal | ICD-10-CM

## 2018-02-27 DIAGNOSIS — Z79899 Other long term (current) drug therapy: ICD-10-CM

## 2018-02-27 LAB — COMPREHENSIVE METABOLIC PANEL
Lab: 0.5 mg/dL (ref 0.2–1.3)
Lab: 1.1 mg/dL — ABNORMAL HIGH (ref 0.66–1.25)
Lab: 102 mmol/L (ref 98–107)
Lab: 107 U/L — ABNORMAL HIGH (ref 17–59)
Lab: 110 mg/dL — ABNORMAL HIGH (ref 74–106)
Lab: 135 mmol/L (ref 134–144)
Lab: 15 mg/dL (ref 9–20)
Lab: 24 mmol/L (ref 22–30)
Lab: 3.7 mmol/L (ref 3.5–5.1)
Lab: 3.9 g/dL (ref 3.5–5.0)
Lab: 60 mL/min (ref 163–337)
Lab: 7.5 g/dL (ref 6.3–8.2)
Lab: 72 U/L (ref 38–126)
Lab: 78 U/L — ABNORMAL HIGH (ref 21–72)
Lab: 8.3 mg/dL — ABNORMAL LOW (ref 8.4–10.2)
Lab: 8.9 pg (ref 25.7–32.2)

## 2018-02-27 LAB — CBC AND DIFF
Lab: 0.1
Lab: 0.1 uL — ABNORMAL HIGH (ref 0.01–0.08)
Lab: 1.4 uL — ABNORMAL HIGH (ref 0.30–0.82)
Lab: 7.1 U/L (ref 4.23–9.07)

## 2018-02-28 ENCOUNTER — Encounter: Admit: 2018-02-28 | Discharge: 2018-02-28 | Payer: MEDICARE

## 2018-03-05 ENCOUNTER — Encounter: Admit: 2018-03-05 | Discharge: 2018-03-05 | Payer: MEDICARE

## 2018-03-05 NOTE — Telephone Encounter
Pt needs new standing lab orders and please fax them to his preferred lab.

## 2018-03-06 ENCOUNTER — Encounter: Admit: 2018-03-06 | Discharge: 2018-03-06 | Payer: MEDICARE

## 2018-03-06 DIAGNOSIS — Z79899 Other long term (current) drug therapy: ICD-10-CM

## 2018-03-06 DIAGNOSIS — Z944 Liver transplant status: Principal | ICD-10-CM

## 2018-03-06 LAB — CBC AND DIFF
Lab: 5.1
Lab: 0.1
Lab: 0.2 uL — ABNORMAL HIGH (ref 0.02–0.1)
Lab: 1.1 %
Lab: 1.2 uL — ABNORMAL HIGH (ref 0.2–1.0)
Lab: 1.7 % — ABNORMAL HIGH (ref 0.0–1.0)
Lab: 10 fl
Lab: 12 % — ABNORMAL HIGH (ref 2.0–10.0)
Lab: 14 g/dL
Lab: 16 % — ABNORMAL HIGH (ref 11.5–14.5)
Lab: 26 pg — ABNORMAL LOW (ref 27.0–31.0)
Lab: 268 K/mm3
Lab: 3 uL
Lab: 30 %
Lab: 31 g/dL — ABNORMAL LOW (ref 32.0–36.0)
Lab: 44 %
Lab: 5.4 mmol/L
Lab: 54 % — ABNORMAL HIGH (ref 14–50)
Lab: 81 fl — ABNORMAL HIGH (ref 9–20)
Lab: 9.6 10*3/uL

## 2018-03-06 LAB — COMPREHENSIVE METABOLIC PANEL: Lab: 138 mmol/L

## 2018-03-10 ENCOUNTER — Encounter: Admit: 2018-03-10 | Discharge: 2018-03-10 | Payer: MEDICARE

## 2018-03-10 DIAGNOSIS — Z944 Liver transplant status: Principal | ICD-10-CM

## 2018-03-10 DIAGNOSIS — Z79899 Other long term (current) drug therapy: ICD-10-CM

## 2018-03-10 LAB — TACROLIMUS IMMUNOASSAY (FK506): Lab: 2.4

## 2018-03-10 LAB — EVEROLIMUS BLOOD: Lab: 6.8

## 2018-03-18 ENCOUNTER — Encounter: Admit: 2018-03-18 | Discharge: 2018-03-18 | Payer: MEDICARE

## 2018-03-18 NOTE — Progress Notes
MEDICATION ASSISTANCE PROGRAM (MAP)  MANUFACTURER SUPPLIED MEDICATION    Drug: Zortress  Child psychotherapist Program: [employer]   Status: Approved  Enrollment Period: 03/18/2018-01/15/2019  MAP or Drug Company to [employer]: [employer]    Notes: Patient eligibility is based off insurance status and/or dx and patient must continue to meet all criteria to remain in the program. Please notify the MAP Program of any changes.     Orland Penman  Medication Assistance Coordinator   [phone]

## 2018-03-19 ENCOUNTER — Encounter: Admit: 2018-03-19 | Discharge: 2018-03-19 | Payer: MEDICARE

## 2018-03-19 DIAGNOSIS — Z944 Liver transplant status: Principal | ICD-10-CM

## 2018-03-19 DIAGNOSIS — Z79899 Other long term (current) drug therapy: ICD-10-CM

## 2018-03-19 DIAGNOSIS — R945 Abnormal results of liver function studies: ICD-10-CM

## 2018-03-19 LAB — COMPREHENSIVE METABOLIC PANEL
Lab: 4.3
Lab: 10 mmol/L
Lab: 106 mmol/L — ABNORMAL LOW (ref >60)
Lab: 27 mmol/L — ABNORMAL LOW (ref >60)
Lab: 3.9 mmol/L (ref 3–12)
Lab: 42 U/L
Lab: 45 U/L
Lab: 7.8 g/dL
Lab: 83 U/L

## 2018-03-20 ENCOUNTER — Encounter: Admit: 2018-03-20 | Discharge: 2018-03-20 | Payer: MEDICARE

## 2018-03-20 DIAGNOSIS — Z944 Liver transplant status: Principal | ICD-10-CM

## 2018-03-20 DIAGNOSIS — R945 Abnormal results of liver function studies: ICD-10-CM

## 2018-03-20 LAB — BILIRUBIN, DIRECT: Lab: 0 — ABNORMAL LOW (ref 0.1–0.3)

## 2018-03-24 ENCOUNTER — Encounter: Admit: 2018-03-24 | Discharge: 2018-03-24 | Payer: MEDICARE

## 2018-03-24 DIAGNOSIS — Z79899 Other long term (current) drug therapy: ICD-10-CM

## 2018-03-24 DIAGNOSIS — Z944 Liver transplant status: Principal | ICD-10-CM

## 2018-04-16 ENCOUNTER — Encounter: Admit: 2018-04-16 | Discharge: 2018-04-16 | Payer: MEDICARE

## 2018-04-16 DIAGNOSIS — Z944 Liver transplant status: Principal | ICD-10-CM

## 2018-04-16 DIAGNOSIS — Z79899 Other long term (current) drug therapy: ICD-10-CM

## 2018-04-16 LAB — CBC AND DIFF
Lab: 4.6
Lab: 0.2 uL — ABNORMAL HIGH (ref 0.02–0.1)
Lab: 0.3
Lab: 1.1 uL — ABNORMAL HIGH (ref 0.2–1.0)
Lab: 12 % — ABNORMAL HIGH (ref 2.0–10.0)
Lab: 2.1 % — ABNORMAL HIGH (ref 0.0–1.0)
Lab: 2.2 uL (ref 1.5–4.0)
Lab: 26 % (ref >60)
Lab: 3.1 % (ref 0.0–6.0)
Lab: 31 g/dL — ABNORMAL LOW (ref 32.0–36.0)
Lab: 55 % (ref >60)
Lab: 81 fl (ref 80–94)
Lab: 9.8 fl (ref 6.5–12.0)

## 2018-04-17 ENCOUNTER — Encounter: Admit: 2018-04-17 | Discharge: 2018-04-17 | Payer: MEDICARE

## 2018-04-17 DIAGNOSIS — Z944 Liver transplant status: Principal | ICD-10-CM

## 2018-04-17 DIAGNOSIS — Z79899 Other long term (current) drug therapy: ICD-10-CM

## 2018-04-17 LAB — TACROLIMUS IMMUNOASSAY (FK506): Lab: 1.6 ng/mL — ABNORMAL LOW (ref 2.0–20.0)

## 2018-04-17 LAB — EVEROLIMUS BLOOD: Lab: 7 ng/mL

## 2018-04-22 ENCOUNTER — Encounter: Admit: 2018-04-22 | Discharge: 2018-04-22 | Payer: MEDICARE

## 2018-05-05 ENCOUNTER — Encounter: Admit: 2018-05-05 | Discharge: 2018-05-05 | Payer: MEDICARE

## 2018-05-05 DIAGNOSIS — Z944 Liver transplant status: Principal | ICD-10-CM

## 2018-05-05 DIAGNOSIS — Z79899 Other long term (current) drug therapy: ICD-10-CM

## 2018-05-05 LAB — COMPREHENSIVE METABOLIC PANEL
Lab: 0.5 mg/dL
Lab: 0.9 mg/dL
Lab: 14 mmol/L
Lab: 16 mg/dL
Lab: 29 mmol/L (ref 0–0.20)
Lab: 4.2
Lab: 47 U/L
Lab: 59 U/L — ABNORMAL HIGH (ref 14–50)
Lab: 83 U/L
Lab: 85 mg/dL
Lab: 9.1 mg/dL
Lab: 92 mL/min

## 2018-07-01 ENCOUNTER — Encounter: Admit: 2018-07-01 | Discharge: 2018-07-01

## 2018-07-01 DIAGNOSIS — Z944 Liver transplant status: Secondary | ICD-10-CM

## 2018-07-01 MED ORDER — TACROLIMUS 0.5 MG PO CAP
1 mg | ORAL_CAPSULE | Freq: Two times a day (BID) | ORAL | 11 refills | 30.00000 days | Status: DC
Start: 2018-07-01 — End: 2018-11-21

## 2018-07-01 NOTE — Telephone Encounter
Call from wife regarding antirejection medication.  She is at the pharmacy.  Ricky Rhodes is out of medication and pharmacy is out.  Please call her for directions.

## 2018-07-01 NOTE — Telephone Encounter
Pt called stating that local pharmacy is out of 1 mg tacrolimus. Script sent for 0.5 mg tabs. Loyal Jacobson, RN

## 2018-07-11 ENCOUNTER — Encounter: Admit: 2018-07-11 | Discharge: 2018-07-11

## 2018-07-16 ENCOUNTER — Encounter: Admit: 2018-07-16 | Discharge: 2018-07-16

## 2018-07-16 NOTE — Telephone Encounter
Patient's spouse called requesting we send [place] order to [facility] to get US done at that facility.[place] orders have been faxed. Patient's spouse states she will call to get patient set up.

## 2018-07-17 ENCOUNTER — Encounter: Admit: 2018-07-17 | Discharge: 2018-07-17

## 2018-07-17 DIAGNOSIS — Z944 Liver transplant status: Secondary | ICD-10-CM

## 2018-07-17 DIAGNOSIS — K7469 Other cirrhosis of liver: Secondary | ICD-10-CM

## 2018-07-17 NOTE — Telephone Encounter
Called pt to discuss Korea abd. She reports that [facility] said that they could not do the doppler. Advised that I can send order to another hospital if needed, and that most hospitals are usually able to do dopplers. She wanted to check with [facility] again to make sure they were clear about the test needed. She will call me back if there are issues. Loyal Jacobson, RN

## 2018-07-28 ENCOUNTER — Encounter: Admit: 2018-07-28 | Discharge: 2018-07-28

## 2018-07-28 NOTE — Telephone Encounter
Left wife a message... Pt. Was not sure if  They had what they needed as far as Zoom and MyChart

## 2018-07-28 NOTE — Telephone Encounter
I spoke with pt and wife. They have downloaded Zoom and have access to his MyChart.

## 2018-07-29 ENCOUNTER — Ambulatory Visit: Admit: 2018-07-29 | Discharge: 2018-07-29

## 2018-07-29 ENCOUNTER — Encounter: Admit: 2018-07-29 | Discharge: 2018-07-29

## 2018-07-29 DIAGNOSIS — C801 Malignant (primary) neoplasm, unspecified: Secondary | ICD-10-CM

## 2018-07-29 DIAGNOSIS — Z944 Liver transplant status: Secondary | ICD-10-CM

## 2018-07-29 DIAGNOSIS — K7460 Unspecified cirrhosis of liver: Secondary | ICD-10-CM

## 2018-07-29 DIAGNOSIS — N200 Calculus of kidney: Secondary | ICD-10-CM

## 2018-07-29 DIAGNOSIS — K754 Autoimmune hepatitis: Secondary | ICD-10-CM

## 2018-07-29 DIAGNOSIS — A0472 Enterocolitis due to Clostridium difficile, not specified as recurrent: Secondary | ICD-10-CM

## 2018-07-29 DIAGNOSIS — K769 Liver disease, unspecified: Secondary | ICD-10-CM

## 2018-07-29 DIAGNOSIS — IMO0002 Squamous cell carcinoma: Secondary | ICD-10-CM

## 2018-07-29 DIAGNOSIS — T148XXA Other injury of unspecified body region, initial encounter: Secondary | ICD-10-CM

## 2018-07-29 DIAGNOSIS — Z1211 Encounter for screening for malignant neoplasm of colon: Secondary | ICD-10-CM

## 2018-07-29 DIAGNOSIS — I8500 Esophageal varices without bleeding: Secondary | ICD-10-CM

## 2018-07-29 NOTE — Progress Notes
The patient  reviewed the three documents sent via MyChart, agrees with the information and provided verbal consent for this visit.

## 2018-07-30 NOTE — Telephone Encounter
[facility] states they do not do Korea for Liver Transplants.

## 2018-07-31 ENCOUNTER — Encounter: Admit: 2018-07-31 | Discharge: 2018-07-31

## 2018-07-31 NOTE — Telephone Encounter
Left msg with date and time of appt also phne # to call if needing to change

## 2018-08-01 NOTE — Progress Notes
Obtained patient's verbal consent to treat them and their agreement to [facility] financial policy and NPP via this telehealth visit during the [facility]    CHIEF COMPLAINT/PURPOSE FOR VISIT:  Routine posttransplant followup.     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     On today's visit, he reports he has been feeling generally well without any worrisome health concerns or complaints.  His weight is stable.  He follows closely with dermatology.     He has not had any other worrisome changes to his health since his last visit with the exception of an uptrend in liver enzymes, his prednisone was increased at that time.       REVIEW OF SYSTEMS:  A 10-point review of systems is performed.  It is notable for the abnormalities listed in the HPI.     PHYSICAL EXAMINATION:   General:  Alert oriented and appropriate.  Body mass index is 30.67 kg/m???.  HEENT:  PERRL EOMI  Respiratory:  Normal breath sounds noted in all lung fields, no adventitious sounds.  Cardiovascular:  Regular rate and rhythm, without murmur.  Abdomen:  Soft, non-tender, non-distended.  Extremities:  No clubbing, cyanosis, or edema.  Neurologic:  Oriented to person, place, time, and event.  No asterixis or tremor.  Skin:  Several forearm lesions are noted stable.  A lesion on his right forearm is noted consistent with tinea.        IMPRESSION/REPORT/PLAN:     #1.  Status post orthotopic liver transplant:  Ricky Rhodes underwent liver transplant in June of 2014.  After his initial recovery from his hospital stay, he has had significant improvement in his overall state of health.  He continues on triple immunotherapy with prednisone at 10 mg daily, Prograf 1 mg BID, and everolimus 2 mg daily.  We will continue these doses until we get his updated labs which are pending.      #2.  Chronic immunosuppression management:  See above.    #3.  Health maintenance:  His weight remains stable. Due for updated colonoscopy, ordered.    #4.  History of skin cancer:  Followed by dermatology.     Total E/M time associated with today's Zoom Telehealth visit 25 minutes.

## 2018-08-05 ENCOUNTER — Encounter: Admit: 2018-08-05 | Discharge: 2018-08-05

## 2018-08-05 DIAGNOSIS — K7469 Other cirrhosis of liver: Secondary | ICD-10-CM

## 2018-08-05 NOTE — Telephone Encounter
Clarified that pt does not need a DEXA scan this year since he had one last year. Also clarified that he will need colonoscopy this year, and that GI will reach out to him shortly. Loyal Jacobson, RN

## 2018-08-06 ENCOUNTER — Encounter: Admit: 2018-08-06 | Discharge: 2018-08-06

## 2018-08-06 DIAGNOSIS — K7469 Other cirrhosis of liver: Secondary | ICD-10-CM

## 2018-08-12 ENCOUNTER — Encounter: Admit: 2018-08-12 | Discharge: 2018-08-12

## 2018-08-13 MED ORDER — OMEPRAZOLE 20 MG PO CPDR
20 mg | ORAL_CAPSULE | Freq: Every day | ORAL | 1 refills | Status: DC
Start: 2018-08-13 — End: 2019-02-16

## 2018-08-13 MED ORDER — PRAVASTATIN 10 MG PO TAB
10 mg | ORAL_TABLET | Freq: Every evening | ORAL | 1 refills | 90.00000 days | Status: DC
Start: 2018-08-13 — End: 2019-02-16

## 2018-08-13 MED ORDER — METOPROLOL TARTRATE 25 MG PO TAB
ORAL_TABLET | Freq: Two times a day (BID) | ORAL | 1 refills | 90.00000 days | Status: DC
Start: 2018-08-13 — End: 2019-02-25

## 2018-08-20 ENCOUNTER — Encounter: Admit: 2018-08-20 | Discharge: 2018-08-20

## 2018-08-22 ENCOUNTER — Encounter: Admit: 2018-08-22 | Discharge: 2018-08-22

## 2018-08-26 ENCOUNTER — Encounter: Admit: 2018-08-26 | Discharge: 2018-08-26

## 2018-08-26 DIAGNOSIS — Z944 Liver transplant status: Secondary | ICD-10-CM

## 2018-08-28 ENCOUNTER — Encounter: Admit: 2018-08-28 | Discharge: 2018-08-28

## 2018-08-28 ENCOUNTER — Ambulatory Visit: Admit: 2018-08-28 | Discharge: 2018-08-28

## 2018-08-28 DIAGNOSIS — Z944 Liver transplant status: Secondary | ICD-10-CM

## 2018-09-08 ENCOUNTER — Encounter: Admit: 2018-09-08 | Discharge: 2018-09-08

## 2018-09-08 DIAGNOSIS — Z944 Liver transplant status: Secondary | ICD-10-CM

## 2018-09-08 LAB — EVEROLIMUS BLOOD: Lab: 7.7 ng/mL

## 2018-09-08 LAB — TACROLIMUS IMMUNOASSAY (FK506): Lab: 1.7 ng/mL — ABNORMAL LOW (ref 2.0–20.0)

## 2018-09-10 ENCOUNTER — Encounter: Admit: 2018-09-10 | Discharge: 2018-09-10

## 2018-09-12 ENCOUNTER — Encounter: Admit: 2018-09-12 | Discharge: 2018-09-12

## 2018-09-29 ENCOUNTER — Encounter: Admit: 2018-09-29 | Discharge: 2018-09-29 | Payer: MEDICARE

## 2018-11-20 ENCOUNTER — Encounter: Admit: 2018-11-20 | Discharge: 2018-11-20 | Payer: MEDICARE

## 2018-11-21 ENCOUNTER — Encounter: Admit: 2018-11-21 | Discharge: 2018-11-21 | Payer: MEDICARE

## 2018-11-21 DIAGNOSIS — Z944 Liver transplant status: Secondary | ICD-10-CM

## 2018-11-21 MED ORDER — TACROLIMUS 0.5 MG PO CAP
1 mg | ORAL_CAPSULE | Freq: Two times a day (BID) | ORAL | 11 refills | 30.00000 days | Status: AC
Start: 2018-11-21 — End: ?
  Filled 2018-11-25: qty 120, 30d supply, fill #1

## 2018-11-21 NOTE — Telephone Encounter
Pt called stating that his tacrolimus is now 100$ d/t COVID. He requests that script be sent to [place]. Plan to send script to pharmacy. NC will get price check before script is sent to pt. Loyal Jacobson, RN

## 2018-11-24 ENCOUNTER — Encounter: Admit: 2018-11-24 | Discharge: 2018-11-24 | Payer: MEDICARE

## 2018-11-24 NOTE — Telephone Encounter
Called Ricky Rhodes to discuss filling of medication regarding Tacrolimus. I spoke with patient and he stated he normally fills thru his local pharmacy, but the cost has increased. Prescription was sent [place] to check cost. I informed patient that the co-pay is 70.86. He stated he will give a call back to pharmacy at [phone].    Stevan Born, [place] Patient Advocate, Specialty Pharmacy  [phone]

## 2018-11-24 NOTE — Telephone Encounter
Called Ricky Rhodes to discuss co-pay amount/delivery preference regarding their medication: Tacrolimus. Informed by nurse coordinator Loyal Jacobson) the he got the tier changed on medication. Left message for patient to give the pharmacy a call back.    Stevan Born, [place] Patient Advocate, Specialty Pharmacy  [phone]

## 2018-11-24 NOTE — Progress Notes
No prior authorization was required for Tacrolimus 0.5 for Ricky Rhodes.  The copay is $6.00.    The specialty pharmacy will reach out to the provider's nurse if the copay becomes unaffordable.        [place] Patient Advocate  [phone]

## 2018-11-25 ENCOUNTER — Encounter: Admit: 2018-11-25 | Discharge: 2018-11-25 | Payer: MEDICARE

## 2018-11-25 NOTE — Telephone Encounter
NC notified by pharm that pts fk is now 6$. NC called and LVM with pt to notify of cost. Loyal Jacobson, RN

## 2018-11-27 ENCOUNTER — Encounter: Admit: 2018-11-27 | Discharge: 2018-11-27 | Payer: MEDICARE

## 2018-11-27 DIAGNOSIS — Z944 Liver transplant status: Secondary | ICD-10-CM

## 2018-11-27 LAB — COMPREHENSIVE METABOLIC PANEL
Lab: 0.5 mg/dL — ABNORMAL LOW (ref 0.2–1.3)
Lab: 1 mg/dL — ABNORMAL HIGH (ref >60)
Lab: 106 mmol/L (ref 96–109)
Lab: 113 mg/dL — ABNORMAL HIGH (ref 75–110)
Lab: 14 mmol/L — ABNORMAL LOW (ref 6.0–18.0)
Lab: 140 mmol/L (ref 137–145)
Lab: 23 mmol/L (ref 22–30)
Lab: 25 mg/dL — ABNORMAL HIGH (ref 9–20)
Lab: 39 U/L (ref 0–50)
Lab: 4.2
Lab: 48 U/L (ref 14–50)
Lab: 7.9 g/dL — ABNORMAL HIGH (ref 6.3–8.2)
Lab: 79 mL/min (ref >60)
Lab: 85 U/L (ref 38–126)
Lab: 9.3 mg/dL — ABNORMAL HIGH (ref 8.0–10.2)

## 2018-11-27 LAB — GGTP: Lab: 50 U/L — ABNORMAL HIGH (ref 15–73)

## 2018-11-27 LAB — CBC AND DIFF
Lab: 0.1
Lab: 0.1 10*3/uL (ref 0.0–0.1)
Lab: 0.8 10*3/uL (ref 0.2–1.0)
Lab: 9.4 10*3/uL (ref 4.8–10.8)

## 2018-12-20 ENCOUNTER — Encounter: Admit: 2018-12-20 | Discharge: 2018-12-20 | Payer: MEDICARE

## 2018-12-21 ENCOUNTER — Encounter: Admit: 2018-12-21 | Discharge: 2018-12-21 | Payer: MEDICARE

## 2018-12-21 MED FILL — TACROLIMUS 0.5 MG PO CAP: 0.5 mg | ORAL | 30 days supply | Qty: 120 | Fill #2 | Status: AC

## 2018-12-29 ENCOUNTER — Encounter: Admit: 2018-12-29 | Discharge: 2018-12-29 | Payer: MEDICARE

## 2018-12-31 ENCOUNTER — Encounter: Admit: 2018-12-31 | Discharge: 2018-12-31 | Payer: MEDICARE

## 2018-12-31 NOTE — Progress Notes
An application has been submitted to [employer] for Zortress.      Lititia Sen  Medication Assistance Coordinator   [phone]

## 2019-01-02 ENCOUNTER — Encounter: Admit: 2019-01-02 | Discharge: 2019-01-02 | Payer: MEDICARE

## 2019-01-15 ENCOUNTER — Encounter: Admit: 2019-01-15 | Discharge: 2019-01-15 | Payer: MEDICARE

## 2019-01-15 MED FILL — TACROLIMUS 0.5 MG PO CAP: 0.5 mg | ORAL | 30 days supply | Qty: 120 | Fill #3 | Status: AC

## 2019-01-19 ENCOUNTER — Encounter: Admit: 2019-01-19 | Discharge: 2019-01-19 | Payer: MEDICARE

## 2019-01-19 DIAGNOSIS — Z944 Liver transplant status: Secondary | ICD-10-CM

## 2019-01-19 MED ORDER — PREDNISONE 5 MG PO TAB
5 mg | ORAL_TABLET | Freq: Every day | ORAL | 1 refills | Status: AC
Start: 2019-01-19 — End: ?

## 2019-02-05 ENCOUNTER — Encounter: Admit: 2019-02-05 | Discharge: 2019-02-05 | Payer: MEDICARE

## 2019-02-05 DIAGNOSIS — Z944 Liver transplant status: Secondary | ICD-10-CM

## 2019-02-05 LAB — COMPREHENSIVE METABOLIC PANEL
Lab: 0.4 mg/dL
Lab: 0.9 mg/dL
Lab: 111 mmol/L — ABNORMAL HIGH (ref 96–109)
Lab: 12 mmol/L
Lab: 142 mmol/L
Lab: 17 mg/dL
Lab: 23 mmol/L
Lab: 4.2 g/dL
Lab: 40 U/L
Lab: 68 U/L — ABNORMAL HIGH (ref 14–50)
Lab: 7.5 g/dL
Lab: 84 U/L
Lab: 88 mL/min
Lab: 89 mg/dL

## 2019-02-05 LAB — CBC AND DIFF
Lab: 0.6 10*3/uL (ref 1.0–4.8)
Lab: 14 % — ABNORMAL HIGH (ref 36–45)
Lab: 16 K/UL — ABNORMAL HIGH (ref 11.5–14.5)
Lab: 2.2 K/UL (ref 1.8–7.0)
Lab: 246 FL (ref 7–11)
Lab: 25 g/dL — ABNORMAL LOW (ref 27.0–31.0)
Lab: 26 % (ref 4–12)
Lab: 31 % — ABNORMAL LOW (ref 32.0–36.0)
Lab: 47 FL (ref 80–100)
Lab: 5.6 % — ABNORMAL LOW (ref >60)
Lab: 5.7 g/dL (ref 12.0–15.0)
Lab: 6.7 % — ABNORMAL HIGH (ref >60)
Lab: 67 % (ref 24–44)
Lab: 8.3 M/UL (ref 4.0–5.0)
Lab: 8.4 % (ref 41–77)

## 2019-02-05 LAB — GGTP: Lab: 46 mmol/L

## 2019-02-09 ENCOUNTER — Encounter: Admit: 2019-02-09 | Discharge: 2019-02-09 | Payer: MEDICARE

## 2019-02-09 DIAGNOSIS — Z944 Liver transplant status: Secondary | ICD-10-CM

## 2019-02-09 LAB — TACROLIMUS IMMUNOASSAY (FK506): Lab: 2.6

## 2019-02-09 LAB — EVEROLIMUS BLOOD: Lab: 6.6

## 2019-02-16 ENCOUNTER — Encounter: Admit: 2019-02-16 | Discharge: 2019-02-16 | Payer: MEDICARE

## 2019-02-16 MED ORDER — PRAVASTATIN 10 MG PO TAB
10 mg | ORAL_TABLET | Freq: Every evening | ORAL | 1 refills | 90.00000 days | Status: AC
Start: 2019-02-16 — End: ?

## 2019-02-16 MED ORDER — OMEPRAZOLE 20 MG PO CPDR
20 mg | ORAL_CAPSULE | Freq: Every day | ORAL | 1 refills | Status: AC
Start: 2019-02-16 — End: ?

## 2019-02-18 ENCOUNTER — Encounter: Admit: 2019-02-18 | Discharge: 2019-02-18 | Payer: MEDICARE

## 2019-02-18 MED FILL — TACROLIMUS 0.5 MG PO CAP: 0.5 mg | ORAL | 30 days supply | Qty: 120 | Fill #4 | Status: AC

## 2019-02-23 ENCOUNTER — Encounter: Admit: 2019-02-23 | Discharge: 2019-02-23 | Payer: MEDICARE

## 2019-02-25 ENCOUNTER — Encounter: Admit: 2019-02-25 | Discharge: 2019-02-25 | Payer: MEDICARE

## 2019-02-25 MED ORDER — METOPROLOL TARTRATE 25 MG PO TAB
ORAL_TABLET | Freq: Two times a day (BID) | ORAL | 1 refills | 90.00000 days | Status: AC
Start: 2019-02-25 — End: ?

## 2019-03-16 ENCOUNTER — Encounter: Admit: 2019-03-16 | Discharge: 2019-03-16 | Payer: MEDICARE

## 2019-03-16 MED FILL — TACROLIMUS 0.5 MG PO CAP: 0.5 mg | ORAL | 30 days supply | Qty: 120 | Fill #5 | Status: AC

## 2019-03-23 ENCOUNTER — Encounter: Admit: 2019-03-23 | Discharge: 2019-03-23 | Payer: MEDICARE

## 2019-03-23 NOTE — Progress Notes
MEDICATION ASSISTANCE PROGRAM (MAP)  MANUFACTURER SUPPLIED MEDICATION    Drug: Zortress  Doctor, hospital Program: [employer]  Status: Approved  Enrollment Period: 03/17/2019-01/15/2020  MAP or Drug Company to [employer]: [employer]    Notes: Patient eligibility is based off insurance status and/or dx and patient must continue to meet all criteria to remain in the program. Please notify the MAP Program of any changes.     Peri Jefferson  Medication Assistance Coordinator   [phone]

## 2019-04-14 ENCOUNTER — Encounter: Admit: 2019-04-14 | Discharge: 2019-04-14 | Payer: MEDICARE

## 2019-04-14 MED FILL — TACROLIMUS 0.5 MG PO CAP: 0.5 mg | ORAL | 30 days supply | Qty: 120 | Fill #6 | Status: AC

## 2019-04-30 ENCOUNTER — Encounter: Admit: 2019-04-30 | Discharge: 2019-04-30 | Payer: MEDICARE

## 2019-04-30 NOTE — Telephone Encounter
LVM introducing myself as new Chief Executive Officer. Requested call back to confirm plans for labs this month & Korea in July.

## 2019-05-14 MED FILL — TACROLIMUS 0.5 MG PO CAP: 0.5 mg | ORAL | 30 days supply | Qty: 120 | Fill #7 | Status: AC

## 2019-05-20 ENCOUNTER — Encounter: Admit: 2019-05-20 | Discharge: 2019-05-20 | Payer: MEDICARE

## 2019-05-20 DIAGNOSIS — R945 Abnormal results of liver function studies: Secondary | ICD-10-CM

## 2019-05-20 DIAGNOSIS — Z944 Liver transplant status: Secondary | ICD-10-CM

## 2019-05-20 NOTE — Telephone Encounter
LVM for [facility] clinic requesting missing lab results from 4/21.

## 2019-05-28 ENCOUNTER — Encounter: Admit: 2019-05-28 | Discharge: 2019-05-28 | Payer: MEDICARE

## 2019-05-29 ENCOUNTER — Encounter: Admit: 2019-05-29 | Discharge: 2019-05-29 | Payer: MEDICARE

## 2019-05-29 DIAGNOSIS — Z944 Liver transplant status: Secondary | ICD-10-CM

## 2019-05-29 LAB — CBC AND DIFF
Lab: 0.9 uL (ref 0.2–1.0)
Lab: 1.8 uL (ref 1.5–4.0)
Lab: 10 % — ABNORMAL HIGH (ref 2.0–10.0)
Lab: 14 g/dL (ref 14.0–18.0)
Lab: 17 % — ABNORMAL HIGH (ref 11.5–14.5)
Lab: 20 % (ref 20.0–40.0)
Lab: 234 10*3/uL (ref 150–400)
Lab: 26 pg — ABNORMAL LOW (ref 27.0–31.0)
Lab: 32 g/dL — ABNORMAL LOW (ref 32.0–36.0)
Lab: 45 % (ref 42–52)
Lab: 5.4 10*6/uL (ref 4.7–6.1)
Lab: 6.1 uL (ref 1.5–7.5)
Lab: 69 % (ref 50.0–70.0)
Lab: 8.9 10*3/uL (ref 4.8–10.8)
Lab: 82 fl (ref 80–94)
Lab: 9.7 fl (ref 6.5–12.0)

## 2019-06-09 ENCOUNTER — Encounter: Admit: 2019-06-09 | Discharge: 2019-06-09 | Payer: MEDICARE

## 2019-06-09 MED FILL — TACROLIMUS 0.5 MG PO CAP: 0.5 mg | ORAL | 30 days supply | Qty: 120 | Fill #8 | Status: AC

## 2019-07-06 ENCOUNTER — Encounter: Admit: 2019-07-06 | Discharge: 2019-07-06 | Payer: MEDICARE

## 2019-07-06 MED FILL — TACROLIMUS 0.5 MG PO CAP: 0.5 mg | ORAL | 30 days supply | Qty: 120 | Fill #9 | Status: AC

## 2019-07-07 ENCOUNTER — Encounter: Admit: 2019-07-07 | Discharge: 2019-07-07 | Payer: MEDICARE

## 2019-08-07 ENCOUNTER — Encounter: Admit: 2019-08-07 | Discharge: 2019-08-07 | Payer: MEDICARE

## 2019-08-07 MED FILL — TACROLIMUS 0.5 MG PO CAP: 0.5 mg | ORAL | 30 days supply | Qty: 120 | Fill #10 | Status: AC

## 2019-08-07 NOTE — Telephone Encounter
LVM requesting call back to confirm whether or not labs have been repeated since April. Requested that he repeat labs before appt if he has not already. Also requested confirmation on scheduling of Korea. If not already completed, may want to consider completing this somewhere other than [facility] as they do not perform dopplers.

## 2019-08-11 ENCOUNTER — Encounter: Admit: 2019-08-11 | Discharge: 2019-08-11 | Payer: MEDICARE

## 2019-08-11 ENCOUNTER — Ambulatory Visit: Admit: 2019-08-11 | Discharge: 2019-08-12 | Payer: MEDICARE

## 2019-08-11 DIAGNOSIS — K7460 Unspecified cirrhosis of liver: Secondary | ICD-10-CM

## 2019-08-11 DIAGNOSIS — N200 Calculus of kidney: Secondary | ICD-10-CM

## 2019-08-11 DIAGNOSIS — Z7952 Long term (current) use of systemic steroids: Secondary | ICD-10-CM

## 2019-08-11 DIAGNOSIS — T148XXA Other injury of unspecified body region, initial encounter: Secondary | ICD-10-CM

## 2019-08-11 DIAGNOSIS — K769 Liver disease, unspecified: Secondary | ICD-10-CM

## 2019-08-11 DIAGNOSIS — Z944 Liver transplant status: Secondary | ICD-10-CM

## 2019-08-11 DIAGNOSIS — I8500 Esophageal varices without bleeding: Secondary | ICD-10-CM

## 2019-08-11 DIAGNOSIS — R945 Abnormal results of liver function studies: Secondary | ICD-10-CM

## 2019-08-11 DIAGNOSIS — C801 Malignant (primary) neoplasm, unspecified: Secondary | ICD-10-CM

## 2019-08-11 DIAGNOSIS — K754 Autoimmune hepatitis: Secondary | ICD-10-CM

## 2019-08-11 DIAGNOSIS — A0472 Enterocolitis due to Clostridium difficile, not specified as recurrent: Secondary | ICD-10-CM

## 2019-08-11 DIAGNOSIS — Z79899 Other long term (current) drug therapy: Secondary | ICD-10-CM

## 2019-08-11 DIAGNOSIS — IMO0002 Squamous cell carcinoma: Secondary | ICD-10-CM

## 2019-08-11 NOTE — Progress Notes
Faxed request for ultrasound report to [facility] [phone].Faxed bone density scan order to [facility] [phone].

## 2019-08-18 ENCOUNTER — Encounter: Admit: 2019-08-18 | Discharge: 2019-08-18 | Payer: MEDICARE

## 2019-08-18 DIAGNOSIS — Z139 Encounter for screening, unspecified: Secondary | ICD-10-CM

## 2019-08-18 DIAGNOSIS — Z944 Liver transplant status: Secondary | ICD-10-CM

## 2019-08-18 NOTE — Telephone Encounter
Called [facility] to request lab & Korea results. They state that it appears pt did not show up for labs; they will fax the Korea.     Called pt. He states that he forgot about his lab appt and will complete labs soon. Instructed pt to notify me when labs have been completed. Also discussed the colonoscopy that was cancelled in Sept. Pt agreeable to placing the referral to GI again to reschedule that.

## 2019-08-19 ENCOUNTER — Encounter: Admit: 2019-08-19 | Discharge: 2019-08-19 | Payer: MEDICARE

## 2019-08-24 ENCOUNTER — Encounter: Admit: 2019-08-24 | Discharge: 2019-08-24 | Payer: MEDICARE

## 2019-08-24 DIAGNOSIS — Z944 Liver transplant status: Secondary | ICD-10-CM

## 2019-08-24 LAB — EVEROLIMUS BLOOD: Lab: 11 ng/mL — ABNORMAL HIGH (ref 3.0–8.0)

## 2019-08-24 LAB — TACROLIMUS LC-MS/MS: Lab: 2.2 ng/mL (ref 2.0–20.0)

## 2019-08-28 ENCOUNTER — Encounter: Admit: 2019-08-28 | Discharge: 2019-08-28 | Payer: MEDICARE

## 2019-08-28 ENCOUNTER — Ambulatory Visit: Admit: 2019-08-28 | Discharge: 2019-08-28 | Payer: MEDICARE

## 2019-08-28 DIAGNOSIS — Z944 Liver transplant status: Secondary | ICD-10-CM

## 2019-08-28 DIAGNOSIS — Z139 Encounter for screening, unspecified: Secondary | ICD-10-CM

## 2019-09-02 ENCOUNTER — Encounter: Admit: 2019-09-02 | Discharge: 2019-09-02 | Payer: MEDICARE

## 2019-09-02 DIAGNOSIS — Z944 Liver transplant status: Secondary | ICD-10-CM

## 2019-09-02 LAB — COMPREHENSIVE METABOLIC PANEL
Lab: 0.5 mg/dL (ref 0.2–1.3)
Lab: 1.1 mg/dL (ref 0.80–1.50)
Lab: 108 mmol/L (ref 96–109)
Lab: 122 mg/dL — ABNORMAL HIGH (ref 75–110)
Lab: 13 mmol/L (ref >60)
Lab: 138 mmol/L (ref 137–145)
Lab: 14 mg/dL (ref 9–20)
Lab: 20 mmol/L — ABNORMAL LOW (ref >60)
Lab: 31 U/L (ref 0–50)
Lab: 4.1 mmol/L (ref 3.5–5.0)
Lab: 40 U/L (ref 14–50)
Lab: 66 mL/min (ref 0–0.80)
Lab: 9 mg/dL (ref 8.0–10.2)

## 2019-09-02 LAB — GGTP: Lab: 45 U/L (ref 15–73)

## 2019-09-02 MED FILL — TACROLIMUS 0.5 MG PO CAP: 0.5 mg | ORAL | 30 days supply | Qty: 120 | Fill #11 | Status: AC

## 2019-09-05 ENCOUNTER — Encounter: Admit: 2019-09-05 | Discharge: 2019-09-05 | Payer: MEDICARE

## 2019-09-09 ENCOUNTER — Encounter: Admit: 2019-09-09 | Discharge: 2019-09-09 | Payer: MEDICARE

## 2019-09-09 DIAGNOSIS — Z79899 Other long term (current) drug therapy: Secondary | ICD-10-CM

## 2019-09-09 DIAGNOSIS — Z944 Liver transplant status: Secondary | ICD-10-CM

## 2019-09-09 DIAGNOSIS — R945 Abnormal results of liver function studies: Secondary | ICD-10-CM

## 2019-09-09 DIAGNOSIS — Z7952 Long term (current) use of systemic steroids: Secondary | ICD-10-CM

## 2019-09-22 ENCOUNTER — Encounter: Admit: 2019-09-22 | Discharge: 2019-09-22 | Payer: MEDICARE

## 2019-09-22 MED ORDER — METOPROLOL TARTRATE 25 MG PO TAB
ORAL_TABLET | Freq: Two times a day (BID) | 1 refills
Start: 2019-09-22 — End: ?

## 2019-09-23 ENCOUNTER — Encounter: Admit: 2019-09-23 | Discharge: 2019-09-23 | Payer: MEDICARE

## 2019-09-25 ENCOUNTER — Encounter: Admit: 2019-09-25 | Discharge: 2019-09-25 | Payer: MEDICARE

## 2019-10-01 ENCOUNTER — Encounter: Admit: 2019-10-01 | Discharge: 2019-10-01 | Payer: MEDICARE

## 2019-10-01 MED FILL — TACROLIMUS 0.5 MG PO CAP: 0.5 mg | ORAL | 30 days supply | Qty: 120 | Fill #12 | Status: AC

## 2019-10-09 ENCOUNTER — Encounter: Admit: 2019-10-09 | Discharge: 2019-10-09 | Payer: MEDICARE

## 2019-10-09 NOTE — Telephone Encounter
Spoke with pt's wife again. She states that PCP prescribed a Prednisone taper. Instructed him to stop taking our ordered Pred 5 mg daily during the taper, then resume the Pred 5 mg daily after completing the taper. Advised that if his symptoms worsen over the weekend, he begins experiencing SOB, persistent fever, extreme fatigue/trouble staying awake, they should go to the ED. Pt's wife v/u.

## 2019-10-09 NOTE — Telephone Encounter
Spoke with pt's wife on the phone. She states that his PCP recommended a steroid, but didn't prescribe one yet.Advised that steroids are fine from a liver standpoint, but it may warrant some temporary adjusting of his transplant meds, so they need to notify me what is prescribed before taking it. She v/u & will call me back later. She states that he tested positive for the flu, but they have chosen to not get tested for COVID.

## 2019-10-11 ENCOUNTER — Encounter: Admit: 2019-10-11 | Discharge: 2019-10-11 | Payer: MEDICARE

## 2019-10-11 NOTE — Telephone Encounter
9629: On-call page received from pt's wife Ricky Rhodes who reports that pt has been ill with Flu-B and strep for the past two weeks and that they are now at [facility] and pt will be admitted for double PNA. This RN advised that admission at local hospital is appropriate and that message will be sent to primary NC.     0950: On-call page received from [name] who provides update that pt has tested positive for COVID. This RN advised that family should take quarantine precautions and that she should be tested. Ricky Rhodes states that she will not get tested but will take precautions.

## 2019-10-28 ENCOUNTER — Encounter: Admit: 2019-10-28 | Discharge: 2019-10-28 | Payer: MEDICARE

## 2019-10-28 NOTE — Telephone Encounter
Ricky Rhodes returned your call.  Please call him.

## 2019-10-28 NOTE — Telephone Encounter
Attempted to reach patient & patient's wife for update on patient after testing positive for COVID. Pt's wife's voicemail box is full. LVM for patient requesting call back with an update.     Plan of care: Patient also needs referral to Endocrinology for osteoporosis noted on August bone density.

## 2019-10-29 ENCOUNTER — Encounter: Admit: 2019-10-29 | Discharge: 2019-10-29 | Payer: MEDICARE

## 2019-11-01 ENCOUNTER — Encounter: Admit: 2019-11-01 | Discharge: 2019-11-01 | Payer: MEDICARE

## 2019-11-01 DIAGNOSIS — Z944 Liver transplant status: Secondary | ICD-10-CM

## 2019-11-01 MED ORDER — TACROLIMUS 0.5 MG PO CAP
1 mg | ORAL_CAPSULE | Freq: Two times a day (BID) | ORAL | 11 refills
Start: 2019-11-01 — End: ?

## 2019-11-02 ENCOUNTER — Encounter: Admit: 2019-11-02 | Discharge: 2019-11-02 | Payer: MEDICARE

## 2019-11-02 MED FILL — TACROLIMUS 0.5 MG PO CAP: 0.5 mg | ORAL | 30 days supply | Qty: 120 | Fill #1 | Status: AC

## 2019-11-10 ENCOUNTER — Encounter: Admit: 2019-11-10 | Discharge: 2019-11-10 | Payer: MEDICARE

## 2019-11-11 ENCOUNTER — Encounter: Admit: 2019-11-11 | Discharge: 2019-11-11 | Payer: MEDICARE

## 2019-11-27 ENCOUNTER — Encounter: Admit: 2019-11-27 | Discharge: 2019-11-27 | Payer: MEDICARE

## 2019-11-27 MED FILL — TACROLIMUS 0.5 MG PO CAP: 0.5 mg | ORAL | 30 days supply | Qty: 120 | Fill #2 | Status: AC

## 2019-11-30 ENCOUNTER — Encounter: Admit: 2019-11-30 | Discharge: 2019-11-30 | Payer: MEDICARE

## 2019-12-03 ENCOUNTER — Encounter: Admit: 2019-12-03 | Discharge: 2019-12-03 | Payer: MEDICARE

## 2019-12-04 ENCOUNTER — Encounter: Admit: 2019-12-04 | Discharge: 2019-12-04 | Payer: MEDICARE

## 2019-12-04 DIAGNOSIS — Z944 Liver transplant status: Secondary | ICD-10-CM

## 2019-12-04 LAB — COMPREHENSIVE METABOLIC PANEL
Lab: 0.6 mg/dL (ref 0.2–1.3)
Lab: 1.1 mg/dL (ref 0.80–1.50)
Lab: 10 mmol/L (ref 6.0–18.0)
Lab: 101 mg/dL (ref 75–110)
Lab: 110 mmol/L — ABNORMAL HIGH (ref 96–109)
Lab: 138 mmol/L (ref 137–145)
Lab: 18 mg/dL (ref 9–20)
Lab: 21 mmol/L — ABNORMAL LOW (ref 22–30)
Lab: 3.9 mmol/L (ref 3.5–5.0)
Lab: 4.3 g/dL (ref 3.5–5.0)
Lab: 40 U/L (ref 0–50)
Lab: 57 U/L — ABNORMAL HIGH (ref 14–50)
Lab: 7.7 g/dL (ref 6.3–8.2)
Lab: 70 mL/min
Lab: 9.1 mg/dL (ref 8.0–10.2)
Lab: 90 U/L (ref 38–126)

## 2019-12-04 LAB — CBC AND DIFF
Lab: 0.6 uL (ref 0.2–1.0)
Lab: 12 g/dL — ABNORMAL LOW (ref 14.0–18.0)
Lab: 16 % — ABNORMAL HIGH (ref 11.5–14.5)
Lab: 2.4 uL (ref 1.5–4.0)
Lab: 213 K/mm3 (ref 150–400)
Lab: 25 pg — ABNORMAL LOW (ref 27.0–31.0)
Lab: 29 % (ref 20.0–40.0)
Lab: 32 g/dL (ref 32.0–36.0)
Lab: 39 % — ABNORMAL LOW (ref 42–52)
Lab: 4.9 M/mm3 (ref 4.7–6.1)
Lab: 5 uL (ref 1.5–7.5)
Lab: 62 % (ref 50.0–70.0)
Lab: 7.6 % (ref 2.0–10.0)
Lab: 79 fl — ABNORMAL LOW (ref 80–94)
Lab: 8 K/mm3 (ref 4.8–10.8)
Lab: 9.3 fl (ref 6.5–12.0)

## 2019-12-04 LAB — GGTP: Lab: 61 U/L (ref 15–73)

## 2019-12-07 ENCOUNTER — Encounter: Admit: 2019-12-07 | Discharge: 2019-12-07 | Payer: MEDICARE

## 2019-12-07 DIAGNOSIS — Z944 Liver transplant status: Secondary | ICD-10-CM

## 2019-12-07 LAB — EVEROLIMUS BLOOD: Lab: 6.3 mg/dL (ref 70–100)

## 2019-12-07 LAB — TACROLIMUS LC-MS/MS: Lab: 1.4 mg/dL — ABNORMAL LOW (ref 2.0–20.0)

## 2019-12-14 ENCOUNTER — Encounter: Admit: 2019-12-14 | Discharge: 2019-12-14 | Payer: MEDICARE

## 2019-12-14 NOTE — Progress Notes
Application for patient's [place] has been submitted to prescriber for signatures.    Peri Jefferson  Medication Assistance Coordinator   [phone]

## 2019-12-21 ENCOUNTER — Encounter: Admit: 2019-12-21 | Discharge: 2019-12-21 | Payer: MEDICARE

## 2019-12-21 NOTE — Progress Notes
An application has been submitted to [employer] for Zortress.      Chianne Byrns  Medication Assistance Coordinator   [phone]

## 2020-01-15 ENCOUNTER — Encounter: Admit: 2020-01-15 | Discharge: 2020-01-15 | Payer: MEDICARE

## 2020-01-15 MED ORDER — EVEROLIMUS (IMMUNOSUPPRESSIVE) 0.5 MG PO TAB
2 mg | ORAL_TABLET | Freq: Two times a day (BID) | ORAL | 0 refills | 30.00000 days | Status: AC
Start: 2020-01-15 — End: ?

## 2020-01-15 NOTE — Telephone Encounter
Pt paged on call NC. His zortress shipment from pt assistance program did not arrive. The spouse said they called yesterday and left msgs but no response. NC will send some tabs to local pharmacy in hopes of them being about to provide but discussed they may have to pay out of pocket. Will notify [facility] so they can check on shipment on Monday after the holiday.

## 2020-01-18 ENCOUNTER — Encounter: Admit: 2020-01-18 | Discharge: 2020-01-18 | Payer: MEDICARE

## 2020-01-18 MED ORDER — EVEROLIMUS (IMMUNOSUPPRESSIVE) 0.5 MG PO TAB
2 mg | ORAL_TABLET | Freq: Two times a day (BID) | ORAL | 0 refills | 30.00000 days | Status: AC
Start: 2020-01-18 — End: ?
  Filled 2020-01-19: qty 112, 14d supply, fill #1

## 2020-01-18 MED ORDER — EVEROLIMUS (IMMUNOSUPPRESSIVE) 0.5 MG PO TAB
2 mg | ORAL_TABLET | Freq: Every day | ORAL | 5 refills | 30.00000 days | Status: DC
Start: 2020-01-18 — End: 2020-01-18

## 2020-01-18 MED ORDER — EVEROLIMUS (IMMUNOSUPPRESSIVE) 0.5 MG PO TAB
2 mg | ORAL_TABLET | Freq: Two times a day (BID) | ORAL | 5 refills | 30.00000 days | Status: AC
Start: 2020-01-18 — End: ?

## 2020-01-18 NOTE — Telephone Encounter
Call from wife wanting to talk to [facility] regarding patient not taking antirejection meds.  Please return her call.

## 2020-01-18 NOTE — Telephone Encounter
Called pt's wife - she states that the person at [employer] actually said "no guarantee" but she would look into getting script filled soon. Advised that we continue to fill short-term supply through [facility] in the meantime. Pt's wife v/u.

## 2020-01-18 NOTE — Telephone Encounter
Sent 14 day supply to [facility] - voucher from SW to be used. Called pt's wife & explained that SW provided voucher that will cover 2 week's supply from our pharmacy. Instructed her to increase pt's Prednisone to 10 mg daily until he is back on everolimus. Instructed her to notify me if she hears any updates from [employer]. Pt's wife v/u.

## 2020-01-18 NOTE — Telephone Encounter
Called & spoke with pt's wife. She states that they didn't pick up the short term supply of everolimus at local pharmacy over the weekend because they thought [employer] would ship the script today. She states that she spoke with [facility] today and learned that the script is still not being shipped. Advised that I also spoke with [employer] today and a new script was faxed to them, but it may not process/ship for 1-2 weeks. Advised that unfortunately pying out-of-pocket for 1-2 week supply may be only option. Advised that I will call local [facility] pharmacy to discuss this.     Called [facility]; pharmacist states that 2 week supply of everolimus is $587. Advised that I will discuss w/ provider & SW to see if voucher is available or any other options.

## 2020-01-18 NOTE — Telephone Encounter
See phone notes.

## 2020-01-18 NOTE — Progress Notes
Outpatient Medication Voucher Assessment    Date:  01/18/2020    Primary Insurance: Yes  Rx Coverage: Yes  Medicaid pending: No  Medicare Part D [place]: No  VA Benefits (include location of services if applicable): No    Is patient eligible for a Medication Voucher? Yes     Date of last Medication Voucher: 02/2015   Ricky Rhodes to be utilized (include name of fund if applicable): pt in need   Narcotics can be included on voucher: No   Other information: Pt needs 2 week supply of Everolimus to bridge until pt assistance program is approved and shipped to pt.      Reminders:    SW has authorized a Medication Voucher for up to $125.  If Voucher is over this amount, please contact SW.     No more than a 30-day supply should be authorized unless. Please contact SW with needs outside of these parameters.      Nicanor Bake, LSCSW  Pager/Cell: [phone]

## 2020-01-19 ENCOUNTER — Encounter: Admit: 2020-01-19 | Discharge: 2020-01-19 | Payer: MEDICARE

## 2020-01-19 NOTE — Progress Notes
MEDICATION ASSISTANCE PROGRAM (MAP)  MANUFACTURER SUPPLIED MEDICATION    Drug: Zortress  Child psychotherapist Program: [employer]  Status: Approved  Enrollment Period: 01/18/2020-01/14/2021  MAP or Drug Company to [employer]: Drug company  Refill Info: Pt to call [phone] for refills    Notes: Patient eligibility is based off insurance status and/or dx and patient must continue to meet all criteria to remain in the program. Please notify the MAP Program of any changes.       Velvet Bathe  Medication Assitance Coordinator  [phone]

## 2020-01-20 ENCOUNTER — Encounter: Admit: 2020-01-20 | Discharge: 2020-01-20 | Payer: MEDICARE

## 2020-01-27 ENCOUNTER — Encounter: Admit: 2020-01-27 | Discharge: 2020-01-27 | Payer: MEDICARE

## 2020-01-27 NOTE — Telephone Encounter
Called [employer] Patient Assistance. They state that the application was approved through Dec 2022, and the everolimus delivered to the patient on January 6th.

## 2020-01-27 NOTE — Telephone Encounter
Called pt's wife - LVM requesting call back for an update on everolimus shipment and whether or not they have received any updates from [employer].

## 2020-02-10 ENCOUNTER — Encounter: Admit: 2020-02-10 | Discharge: 2020-02-10 | Payer: MEDICARE

## 2020-02-10 MED FILL — TACROLIMUS 0.5 MG PO CAP: 0.5 mg | ORAL | 30 days supply | Qty: 120 | Fill #3 | Status: AC

## 2020-02-11 ENCOUNTER — Encounter: Admit: 2020-02-11 | Discharge: 2020-02-11 | Payer: MEDICARE

## 2020-02-11 MED ORDER — EVEROLIMUS (IMMUNOSUPPRESSIVE) 0.5 MG PO TAB
2 mg | ORAL_TABLET | Freq: Two times a day (BID) | ORAL | 0 refills | Status: CN
Start: 2020-02-11 — End: ?

## 2020-02-17 ENCOUNTER — Encounter: Admit: 2020-02-17 | Discharge: 2020-02-17 | Payer: MEDICARE

## 2020-03-10 ENCOUNTER — Encounter: Admit: 2020-03-10 | Discharge: 2020-03-10 | Payer: MEDICARE

## 2020-03-16 ENCOUNTER — Encounter: Admit: 2020-03-16 | Discharge: 2020-03-16 | Payer: MEDICARE

## 2020-03-16 DIAGNOSIS — Z944 Liver transplant status: Secondary | ICD-10-CM

## 2020-03-16 DIAGNOSIS — K7689 Other specified diseases of liver: Secondary | ICD-10-CM

## 2020-03-17 ENCOUNTER — Encounter: Admit: 2020-03-17 | Discharge: 2020-03-17 | Payer: MEDICARE

## 2020-03-17 ENCOUNTER — Ambulatory Visit: Admit: 2020-03-17 | Discharge: 2020-03-17 | Payer: MEDICARE

## 2020-03-17 DIAGNOSIS — IMO0002 Squamous cell carcinoma: Secondary | ICD-10-CM

## 2020-03-17 DIAGNOSIS — K769 Liver disease, unspecified: Secondary | ICD-10-CM

## 2020-03-17 DIAGNOSIS — C801 Malignant (primary) neoplasm, unspecified: Secondary | ICD-10-CM

## 2020-03-17 DIAGNOSIS — K754 Autoimmune hepatitis: Secondary | ICD-10-CM

## 2020-03-17 DIAGNOSIS — A0472 Enterocolitis due to Clostridium difficile, not specified as recurrent: Secondary | ICD-10-CM

## 2020-03-17 DIAGNOSIS — I8500 Esophageal varices without bleeding: Secondary | ICD-10-CM

## 2020-03-17 DIAGNOSIS — K7460 Unspecified cirrhosis of liver: Secondary | ICD-10-CM

## 2020-03-17 DIAGNOSIS — T148XXA Other injury of unspecified body region, initial encounter: Secondary | ICD-10-CM

## 2020-03-17 DIAGNOSIS — N200 Calculus of kidney: Secondary | ICD-10-CM

## 2020-03-17 DIAGNOSIS — Z944 Liver transplant status: Secondary | ICD-10-CM

## 2020-03-17 MED ORDER — PROPOFOL 10 MG/ML IV EMUL 20 ML (INFUSION)(AM)(OR)
INTRAVENOUS | 0 refills | Status: DC
Start: 2020-03-17 — End: 2020-03-17
  Administered 2020-03-17: 16:00:00 170 ug/kg/min via INTRAVENOUS
  Administered 2020-03-17: 15:00:00 140 ug/kg/min via INTRAVENOUS

## 2020-03-17 MED ORDER — LIDOCAINE (PF) 20 MG/ML (2 %) IJ SOLN
INTRAVENOUS | 0 refills | Status: DC
Start: 2020-03-17 — End: 2020-03-17
  Administered 2020-03-17: 15:00:00 40 mg via INTRAVENOUS
  Administered 2020-03-17: 15:00:00 60 mg via INTRAVENOUS

## 2020-03-17 MED ADMIN — LACTATED RINGERS IV SOLP [4318]: 1000.000 mL | INTRAVENOUS | @ 15:00:00 | Stop: 2020-03-17 | NDC 00338011704

## 2020-03-17 NOTE — Anesthesia Post-Procedure Evaluation
Post-Anesthesia Evaluation    Name: Ricky Rhodes      MRN: [id number]     DOB: [birth year]     Age: 45 y.o.     Sex: male   __________________________________________________________________________     Procedure Information     Anesthesia Start Date/Time: 03/17/20 0918    Procedures:       COLONOSCOPY DIAGNOSTIC WITH SPECIMEN COLLECTION BY BRUSHING/ WASHING - FLEXIBLE (N/A )      COLONOSCOPY WITH SNARE REMOVAL TUMOR/ POLYP/ OTHER LESION    Location: ENDO 4 / ENDO/GI    Surgeons: Vonzell Schlatter, MD          Post-Anesthesia Vitals  BP: 130/92 (03/03 1010)  Temp: 36.5 C (97.7 F) (03/03 0948)  Pulse: 75 (03/03 1010)  Respirations: 18 PER MINUTE (03/03 1010)  SpO2: 97 % (03/03 1010)  SpO2 Pulse: 76 (03/03 1010)   Vitals Value Taken Time   BP 130/92 03/17/20 1010   Temp 36.5 C (97.7 F) 03/17/20 0948   Pulse 75 03/17/20 1010   Respirations 18 PER MINUTE 03/17/20 1010   SpO2 97 % 03/17/20 1010   ABP     ART BP           Post Anesthesia Evaluation Note    Evaluation location: Pre/Post  Patient participation: recovered; patient participated in evaluation  Level of consciousness: alert    Pain score: 0  Pain management: adequate    Hydration: normovolemia  Temperature: 36.0C - 38.4C  Airway patency: adequate    Perioperative Events       Post-op nausea and vomiting: no PONV    Postoperative Status  Cardiovascular status: hemodynamically stable  Respiratory status: spontaneous ventilation        Perioperative Events  There were no known complications for this encounter.

## 2020-03-18 ENCOUNTER — Encounter: Admit: 2020-03-18 | Discharge: 2020-03-18 | Payer: MEDICARE

## 2020-03-18 DIAGNOSIS — T148XXA Other injury of unspecified body region, initial encounter: Secondary | ICD-10-CM

## 2020-03-18 DIAGNOSIS — C801 Malignant (primary) neoplasm, unspecified: Secondary | ICD-10-CM

## 2020-03-18 DIAGNOSIS — I8500 Esophageal varices without bleeding: Secondary | ICD-10-CM

## 2020-03-18 DIAGNOSIS — K7460 Unspecified cirrhosis of liver: Secondary | ICD-10-CM

## 2020-03-18 DIAGNOSIS — N200 Calculus of kidney: Secondary | ICD-10-CM

## 2020-03-18 DIAGNOSIS — IMO0002 Squamous cell carcinoma: Secondary | ICD-10-CM

## 2020-03-18 DIAGNOSIS — K769 Liver disease, unspecified: Secondary | ICD-10-CM

## 2020-03-18 DIAGNOSIS — A0472 Enterocolitis due to Clostridium difficile, not specified as recurrent: Secondary | ICD-10-CM

## 2020-03-18 DIAGNOSIS — K754 Autoimmune hepatitis: Secondary | ICD-10-CM

## 2020-03-18 DIAGNOSIS — Z944 Liver transplant status: Secondary | ICD-10-CM

## 2020-03-22 ENCOUNTER — Encounter: Admit: 2020-03-22 | Discharge: 2020-03-22 | Payer: MEDICARE

## 2020-03-22 DIAGNOSIS — Z944 Liver transplant status: Secondary | ICD-10-CM

## 2020-03-22 DIAGNOSIS — K7689 Other specified diseases of liver: Secondary | ICD-10-CM

## 2020-03-22 LAB — EVEROLIMUS BLOOD: Lab: 7.2 ng/mL — ABNORMAL LOW (ref 8.0–8.0)

## 2020-03-22 LAB — TACROLIMUS LC-MS/MS: Lab: 2 ng/mL (ref 2.0–20.0)

## 2020-03-23 ENCOUNTER — Encounter: Admit: 2020-03-23 | Discharge: 2020-03-23 | Payer: MEDICARE

## 2020-03-23 DIAGNOSIS — K7689 Other specified diseases of liver: Secondary | ICD-10-CM

## 2020-03-23 DIAGNOSIS — Z944 Liver transplant status: Secondary | ICD-10-CM

## 2020-03-23 LAB — 25-OH VITAMIN D (D2 + D3): Lab: 58 ng/mL (ref 30–100)

## 2020-03-31 ENCOUNTER — Encounter: Admit: 2020-03-31 | Discharge: 2020-03-31 | Payer: MEDICARE

## 2020-04-04 ENCOUNTER — Encounter: Admit: 2020-04-04 | Discharge: 2020-04-04 | Payer: MEDICARE

## 2020-04-04 DIAGNOSIS — Z944 Liver transplant status: Secondary | ICD-10-CM

## 2020-04-04 MED FILL — TACROLIMUS 0.5 MG PO CAP: 0.5 mg | ORAL | 30 days supply | Qty: 120 | Fill #4 | Status: AC

## 2020-04-05 ENCOUNTER — Encounter: Admit: 2020-04-05 | Discharge: 2020-04-05 | Payer: MEDICARE

## 2020-04-05 MED ORDER — OMEPRAZOLE 20 MG PO CPDR
20 mg | ORAL_CAPSULE | Freq: Every day | ORAL | 1 refills | Status: AC
Start: 2020-04-05 — End: ?

## 2020-04-06 ENCOUNTER — Encounter: Admit: 2020-04-06 | Discharge: 2020-04-06 | Payer: MEDICARE

## 2020-05-02 ENCOUNTER — Ambulatory Visit: Admit: 2020-05-02 | Discharge: 2020-05-02 | Payer: MEDICARE

## 2020-05-02 ENCOUNTER — Encounter: Admit: 2020-05-02 | Discharge: 2020-05-02 | Payer: MEDICARE

## 2020-05-02 DIAGNOSIS — Z7952 Long term (current) use of systemic steroids: Secondary | ICD-10-CM

## 2020-05-02 DIAGNOSIS — M8589 Other specified disorders of bone density and structure, multiple sites: Secondary | ICD-10-CM

## 2020-05-02 DIAGNOSIS — Z944 Liver transplant status: Secondary | ICD-10-CM

## 2020-05-02 DIAGNOSIS — R635 Abnormal weight gain: Secondary | ICD-10-CM

## 2020-05-02 DIAGNOSIS — K754 Autoimmune hepatitis: Secondary | ICD-10-CM

## 2020-05-02 DIAGNOSIS — I8500 Esophageal varices without bleeding: Secondary | ICD-10-CM

## 2020-05-02 DIAGNOSIS — N200 Calculus of kidney: Secondary | ICD-10-CM

## 2020-05-02 DIAGNOSIS — M818 Other osteoporosis without current pathological fracture: Secondary | ICD-10-CM

## 2020-05-02 DIAGNOSIS — A0472 Enterocolitis due to Clostridium difficile, not specified as recurrent: Secondary | ICD-10-CM

## 2020-05-02 DIAGNOSIS — K769 Liver disease, unspecified: Secondary | ICD-10-CM

## 2020-05-02 DIAGNOSIS — K7460 Unspecified cirrhosis of liver: Secondary | ICD-10-CM

## 2020-05-02 DIAGNOSIS — IMO0002 Squamous cell carcinoma: Secondary | ICD-10-CM

## 2020-05-02 DIAGNOSIS — C801 Malignant (primary) neoplasm, unspecified: Secondary | ICD-10-CM

## 2020-05-02 DIAGNOSIS — T148XXA Other injury of unspecified body region, initial encounter: Secondary | ICD-10-CM

## 2020-05-02 LAB — COMPREHENSIVE METABOLIC PANEL
Lab: 0.6 mg/dL (ref 0.3–1.2)
Lab: 1 mg/dL (ref 0.4–1.24)
Lab: 10 (ref 3–12)
Lab: 10 mg/dL (ref 7–25)
Lab: 109 MMOL/L (ref 98–110)
Lab: 138 MMOL/L (ref 137–147)
Lab: 19 MMOL/L — ABNORMAL LOW (ref 21–30)
Lab: 37 U/L (ref 7–40)
Lab: 4.2 g/dL (ref 3.5–5.0)
Lab: 4.3 MMOL/L (ref 3.5–5.1)
Lab: 46 U/L (ref 7–56)
Lab: 7.3 g/dL (ref 6.0–8.0)
Lab: 8.9 mg/dL (ref 8.5–10.6)
Lab: 87 U/L (ref 25–110)
Lab: 89 mg/dL (ref 70–100)
Lab: >60 mL/min (ref >60)

## 2020-05-02 LAB — TSH WITH FREE T4 REFLEX: Lab: 0.8 uU/mL (ref 0.35–5.00)

## 2020-05-02 LAB — PARATHYROID HORMONE: Lab: 49 pg/mL (ref 10–65)

## 2020-05-02 LAB — PHOSPHORUS: Lab: 2.1 mg/dL (ref 2.0–4.5)

## 2020-05-02 NOTE — Telephone Encounter
-----   Message from Young Berry, MD sent at 05/02/2020 10:32 AM CDT -----  Regarding: litholink  Hi     May we please request litholink order for this patient?    Thank you,   Benjamine Mola

## 2020-05-02 NOTE — Telephone Encounter
PC to [facility]; spoke with Ricky Rhodes says there is no registration information for the pt, meaning that they have not sent specimen collection container to pt or received any faxed documentation for pt    Routing to Dr. Oley Balm to update

## 2020-05-02 NOTE — Telephone Encounter
Completed Litholink test request form    Gave to Dr. Oley Balm to sign/date; routing to Dr. Oley Balm to update

## 2020-05-02 NOTE — Telephone Encounter
May we please call [facility] and ask for the full report, for patient's bone density from 08/2019.   I am specifically interested in the Z scores for this bone density.    Thank you

## 2020-05-02 NOTE — Telephone Encounter
PC to [facility]; spoke with Raquel Sarna  Requested 08/2019 DXA    Routing to Dr. Oley Balm to update

## 2020-05-02 NOTE — Telephone Encounter
Form signed.   Thank you

## 2020-05-02 NOTE — Telephone Encounter
Fax from [facility] with 08/2019 DXA  08/2019 DXA does not contain Z-score information    PC to [facility]; spoke with Raquel Sarna  Inquired about longer report  Raquel Sarna indicates she will have to return PC    PC from [place] [facility] to cloud over images, which contain Z-score information    Routing to Dr. Oley Balm to update

## 2020-05-02 NOTE — Telephone Encounter
PC to [facility]; unable to remain on hold d/t other pt's needs

## 2020-05-02 NOTE — Telephone Encounter
Faxed signed Litholink order form to [place] confirmation fax was successful

## 2020-05-02 NOTE — Telephone Encounter
Thank you

## 2020-05-05 ENCOUNTER — Encounter: Admit: 2020-05-05 | Discharge: 2020-05-05 | Payer: MEDICARE

## 2020-05-05 MED ORDER — METOPROLOL TARTRATE 25 MG PO TAB
12.5 mg | ORAL_TABLET | Freq: Two times a day (BID) | ORAL | 1 refills | 90.00000 days | Status: AC
Start: 2020-05-05 — End: ?

## 2020-05-05 NOTE — Telephone Encounter
Returned call; pharmacy requested metoprolol to be refilled at [facility] [place] location. Sent script.

## 2020-05-05 NOTE — Telephone Encounter
Mallory called from [facility] on a refill for the pt. Please call her back.

## 2020-05-09 ENCOUNTER — Encounter: Admit: 2020-05-09 | Discharge: 2020-05-09 | Payer: MEDICARE

## 2020-05-09 MED FILL — TACROLIMUS 0.5 MG PO CAP: 0.5 mg | ORAL | 30 days supply | Qty: 120 | Fill #5 | Status: AC

## 2020-05-10 ENCOUNTER — Encounter: Admit: 2020-05-10 | Discharge: 2020-05-10 | Payer: MEDICARE

## 2020-05-13 ENCOUNTER — Encounter: Admit: 2020-05-13 | Discharge: 2020-05-13 | Payer: MEDICARE

## 2020-05-18 ENCOUNTER — Encounter: Admit: 2020-05-18 | Discharge: 2020-05-18 | Payer: MEDICARE

## 2020-05-18 DIAGNOSIS — R69 Illness, unspecified: Secondary | ICD-10-CM

## 2020-05-19 ENCOUNTER — Encounter: Admit: 2020-05-19 | Discharge: 2020-05-19 | Payer: MEDICARE

## 2020-05-20 ENCOUNTER — Encounter: Admit: 2020-05-20 | Discharge: 2020-05-20 | Payer: MEDICARE

## 2020-05-22 MED FILL — TACROLIMUS 0.5 MG PO CAP: 0.5 mg | ORAL | 30 days supply | Qty: 120 | Fill #5 | Status: AC

## 2020-06-22 ENCOUNTER — Encounter: Admit: 2020-06-22 | Discharge: 2020-06-22 | Payer: MEDICARE

## 2020-06-24 ENCOUNTER — Encounter: Admit: 2020-06-24 | Discharge: 2020-06-24 | Payer: MEDICARE

## 2020-06-26 ENCOUNTER — Encounter: Admit: 2020-06-26 | Discharge: 2020-06-26 | Payer: MEDICARE

## 2020-06-27 ENCOUNTER — Encounter: Admit: 2020-06-27 | Discharge: 2020-06-27 | Payer: MEDICARE

## 2020-06-27 MED FILL — TACROLIMUS 0.5 MG PO CAP: 0.5 mg | ORAL | 30 days supply | Qty: 120 | Fill #6 | Status: AC

## 2020-06-28 ENCOUNTER — Encounter: Admit: 2020-06-28 | Discharge: 2020-06-28 | Payer: MEDICARE

## 2020-07-04 ENCOUNTER — Encounter: Admit: 2020-07-04 | Discharge: 2020-07-04 | Payer: MEDICARE

## 2020-07-22 ENCOUNTER — Encounter: Admit: 2020-07-22 | Discharge: 2020-07-22 | Payer: MEDICARE

## 2020-07-22 MED FILL — TACROLIMUS 0.5 MG PO CAP: 0.5 mg | ORAL | 30 days supply | Qty: 120 | Fill #7 | Status: AC

## 2020-07-25 ENCOUNTER — Encounter: Admit: 2020-07-25 | Discharge: 2020-07-25 | Payer: MEDICARE

## 2020-07-26 ENCOUNTER — Encounter: Admit: 2020-07-26 | Discharge: 2020-07-26 | Payer: MEDICARE

## 2020-08-02 ENCOUNTER — Encounter: Admit: 2020-08-02 | Discharge: 2020-08-02 | Payer: MEDICARE

## 2020-08-02 DIAGNOSIS — Z944 Liver transplant status: Secondary | ICD-10-CM

## 2020-08-02 LAB — CBC AND DIFF
ABSOLUTE LYMPH COUNT: 2.7
ABSOLUTE MONO COUNT: 0.8
ABSOLUTE NEUTROPHIL: 5
HEMATOCRIT: 44
HEMOGLOBIN: 14
LYMPHOCYTES: 31
MCH: 26 — ABNORMAL LOW (ref 27.0–31.0)
MCHC: 32
MCV: 79 — ABNORMAL LOW (ref 80–94)
MONOCYTES %: 9.2
MPV: 9.2
NEUTROPHILS %: 59
PLATELET COUNT: 244
RBC COUNT: 5.5
RDW: 15 — ABNORMAL HIGH (ref 11.5–14.5)
WBC COUNT: 8.4

## 2020-08-02 NOTE — Telephone Encounter
Spoke with patient's spouse, confirmed appointment on 08/05/2020 at 0830 with Carole Civil, APRN. Korea MOB 0745. NPO instructions given.

## 2020-08-04 ENCOUNTER — Encounter: Admit: 2020-08-04 | Discharge: 2020-08-04 | Payer: MEDICARE

## 2020-08-05 ENCOUNTER — Encounter: Admit: 2020-08-05 | Discharge: 2020-08-05 | Payer: MEDICARE

## 2020-08-05 ENCOUNTER — Ambulatory Visit: Admit: 2020-08-05 | Discharge: 2020-08-05 | Payer: MEDICARE

## 2020-08-05 DIAGNOSIS — Z944 Liver transplant status: Secondary | ICD-10-CM

## 2020-08-05 DIAGNOSIS — T8649 Other complications of liver transplant: Secondary | ICD-10-CM

## 2020-08-05 DIAGNOSIS — Z0000 Encounter for general adult medical examination without abnormal findings: Secondary | ICD-10-CM

## 2020-08-05 DIAGNOSIS — I8500 Esophageal varices without bleeding: Secondary | ICD-10-CM

## 2020-08-05 DIAGNOSIS — K769 Liver disease, unspecified: Secondary | ICD-10-CM

## 2020-08-05 DIAGNOSIS — T148XXA Other injury of unspecified body region, initial encounter: Secondary | ICD-10-CM

## 2020-08-05 DIAGNOSIS — K754 Autoimmune hepatitis: Secondary | ICD-10-CM

## 2020-08-05 DIAGNOSIS — N200 Calculus of kidney: Secondary | ICD-10-CM

## 2020-08-05 DIAGNOSIS — D849 Immunodeficiency, unspecified: Secondary | ICD-10-CM

## 2020-08-05 DIAGNOSIS — IMO0002 Squamous cell carcinoma: Secondary | ICD-10-CM

## 2020-08-05 DIAGNOSIS — A0472 Enterocolitis due to Clostridium difficile, not specified as recurrent: Secondary | ICD-10-CM

## 2020-08-05 DIAGNOSIS — C801 Malignant (primary) neoplasm, unspecified: Secondary | ICD-10-CM

## 2020-08-05 DIAGNOSIS — K7460 Unspecified cirrhosis of liver: Secondary | ICD-10-CM

## 2020-08-05 DIAGNOSIS — R945 Abnormal results of liver function studies: Secondary | ICD-10-CM

## 2020-08-05 LAB — COMPREHENSIVE METABOLIC PANEL
ALBUMIN: 4.1 g/dL (ref 3.5–5.0)
ALK PHOSPHATASE: 87 U/L (ref 38–126)
ALT: 28 U/L (ref 0–50)
ANION GAP: 13 mmol/L (ref 6.0–18.0)
AST: 43 U/L (ref 14–50)
BLD UREA NITROGEN: 18 mg/dL (ref 9–20)
CALCIUM: 9 mg/dL (ref 8.0–10.2)
CO2: 21 mmol/L — ABNORMAL LOW (ref 22–30)
CREATININE: 1.2 mg/dL (ref 0.80–1.50)
EGFR: 65 mL/min
GLUCOSE,RANDOM: 155 mg/dL — ABNORMAL HIGH (ref 75–110)
POTASSIUM: 3.9
SODIUM: 141 mmol/L (ref 137–145)
TOTAL BILIRUBIN: 0.4 mg/dL (ref 0.2–1.3)
TOTAL PROTEIN: 7.7 g/dL (ref 6.3–8.2)

## 2020-08-05 LAB — GGTP: GGTP: 50 U/L (ref 15–73)

## 2020-08-05 LAB — EVEROLIMUS BLOOD: EVEROLIMUS: 7.9 ng/mL — ABNORMAL HIGH (ref 3.0–8.0)

## 2020-08-15 ENCOUNTER — Encounter: Admit: 2020-08-15 | Discharge: 2020-08-15 | Payer: MEDICARE

## 2020-08-16 ENCOUNTER — Encounter: Admit: 2020-08-16 | Discharge: 2020-08-16 | Payer: MEDICARE

## 2020-08-16 DIAGNOSIS — K769 Liver disease, unspecified: Secondary | ICD-10-CM

## 2020-08-16 DIAGNOSIS — Z944 Liver transplant status: Secondary | ICD-10-CM

## 2020-08-16 NOTE — Telephone Encounter
Called & spoke with pt's wife. Explained US findings and plan for MRI for better identify liver findings. She v/u & states that they would be ok with coming to [place] for MRI.     Called Radiology scheduling - earliest available MRI appt 8/28. Scheduled pt for Sun, 8/28 at 1:45. Check-in at 1:15. Relayed this appt info to pt's wife. Also provided her with [place] scheduling phone # and encouraged them to call to see if something sooner opens up if they would like. She states that pt was nervous about this and requests that I call to explain to pt.     Called & spoke with pt. Explained US findings & reasoning for MRI f/u. Pt v/u. Encouraged him to call anytime with questions/concerns.

## 2020-08-17 ENCOUNTER — Encounter: Admit: 2020-08-17 | Discharge: 2020-08-17 | Payer: MEDICARE

## 2020-08-17 MED FILL — TACROLIMUS 0.5 MG PO CAP: 0.5 mg | ORAL | 30 days supply | Qty: 120 | Fill #8 | Status: AC

## 2020-09-11 ENCOUNTER — Ambulatory Visit: Admit: 2020-09-11 | Discharge: 2020-09-11 | Payer: MEDICARE

## 2020-09-11 ENCOUNTER — Encounter: Admit: 2020-09-11 | Discharge: 2020-09-11 | Payer: MEDICARE

## 2020-09-11 DIAGNOSIS — Z944 Liver transplant status: Secondary | ICD-10-CM

## 2020-09-11 DIAGNOSIS — K769 Liver disease, unspecified: Secondary | ICD-10-CM

## 2020-09-11 MED ORDER — GADOXETATE 0.25 MMOL/ML (181.43 MG/ML) IV SOLN
10 mL | Freq: Once | INTRAVENOUS | 0 refills | Status: CP
Start: 2020-09-11 — End: ?
  Administered 2020-09-11: 19:00:00 10 mL via INTRAVENOUS

## 2020-09-11 MED ORDER — SODIUM CHLORIDE 0.9 % IJ SOLN
50 mL | Freq: Once | INTRAVENOUS | 0 refills | Status: CP
Start: 2020-09-11 — End: ?
  Administered 2020-09-11: 19:00:00 50 mL via INTRAVENOUS

## 2020-09-12 ENCOUNTER — Encounter: Admit: 2020-09-12 | Discharge: 2020-09-12 | Payer: MEDICARE

## 2020-09-12 NOTE — Telephone Encounter
Call asking about results o f MRI.  Please return her call.

## 2020-09-12 NOTE — Telephone Encounter
Spoke with patient - advised that MRI is reassuring with no concerning findings on the liver. 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 Pt v/u.

## 2020-09-14 ENCOUNTER — Encounter: Admit: 2020-09-14 | Discharge: 2020-09-14 | Payer: MEDICARE

## 2020-09-14 MED FILL — TACROLIMUS 0.5 MG PO CAP: 0.5 mg | ORAL | 30 days supply | Qty: 120 | Fill #9 | Status: AC

## 2020-10-10 ENCOUNTER — Encounter: Admit: 2020-10-10 | Discharge: 2020-10-10 | Payer: MEDICARE

## 2020-10-10 DIAGNOSIS — Z944 Liver transplant status: Secondary | ICD-10-CM

## 2020-10-10 MED ORDER — PREDNISONE 5 MG PO TAB
5 mg | ORAL_TABLET | Freq: Every day | ORAL | 1 refills | Status: AC
Start: 2020-10-10 — End: ?

## 2020-10-10 MED ORDER — PRAVASTATIN 10 MG PO TAB
10 mg | ORAL_TABLET | Freq: Every evening | ORAL | 3 refills | 90.00000 days | Status: AC
Start: 2020-10-10 — End: ?

## 2020-10-11 ENCOUNTER — Encounter: Admit: 2020-10-11 | Discharge: 2020-10-11 | Payer: MEDICARE

## 2020-10-11 MED FILL — TACROLIMUS 0.5 MG PO CAP: 0.5 mg | ORAL | 30 days supply | Qty: 120 | Fill #10 | Status: AC

## 2020-10-20 ENCOUNTER — Encounter: Admit: 2020-10-20 | Discharge: 2020-10-20 | Payer: MEDICARE

## 2020-10-27 ENCOUNTER — Encounter: Admit: 2020-10-27 | Discharge: 2020-10-27 | Payer: MEDICARE

## 2020-10-27 MED ORDER — OMEPRAZOLE 20 MG PO CPDR
20 mg | ORAL_CAPSULE | Freq: Every day | ORAL | 1 refills
Start: 2020-10-27 — End: ?

## 2020-11-11 ENCOUNTER — Encounter: Admit: 2020-11-11 | Discharge: 2020-11-11 | Payer: MEDICARE

## 2020-11-11 DIAGNOSIS — Z944 Liver transplant status: Secondary | ICD-10-CM

## 2020-11-11 LAB — CBC AND DIFF
ABSOLUTE BASO COUNT: 0.1
ABSOLUTE EOS COUNT: 0.4
ABSOLUTE LYMPH COUNT: 2.4
ABSOLUTE MONO COUNT: 1.2 — ABNORMAL HIGH (ref 0.2–1.0)
ABSOLUTE NEUTROPHIL: 5.7
BASOPHILS: 0.5
EOSINOPHIL %: 4.2
HEMOGLOBIN: 14 MMOL/L (ref 137–147)
MONOCYTES %: 11 — ABNORMAL HIGH (ref 2.0–10.0)
NEUTROPHILS %: 58 mg/dL (ref 8.5–10.6)
RBC COUNT: 5.3
WBC COUNT: 9.7

## 2020-11-14 ENCOUNTER — Encounter: Admit: 2020-11-14 | Discharge: 2020-11-14 | Payer: MEDICARE

## 2020-11-14 DIAGNOSIS — Z944 Liver transplant status: Secondary | ICD-10-CM

## 2020-11-15 ENCOUNTER — Encounter: Admit: 2020-11-15 | Discharge: 2020-11-15 | Payer: MEDICARE

## 2020-11-15 DIAGNOSIS — Z944 Liver transplant status: Secondary | ICD-10-CM

## 2020-11-15 LAB — BILIRUBIN, DIRECT: DIRECT BILIRUBIN: 0 — ABNORMAL LOW (ref 0.1–0.3)

## 2020-11-15 LAB — EVEROLIMUS BLOOD: EVEROLIMUS: 7.1

## 2020-11-15 MED ORDER — TACROLIMUS 0.5 MG PO CAP
1 mg | ORAL_CAPSULE | Freq: Two times a day (BID) | ORAL | 11 refills | 30.00000 days | Status: AC
Start: 2020-11-15 — End: ?
  Filled 2020-11-16: qty 120, 30d supply, fill #1

## 2020-11-15 NOTE — Telephone Encounter
Called & spoke with pt. Reviewed mildly elevated liver enzymes. Pt denies any recent illness/new meds/other changes. Pt feels well. Advised that we can plan to continue labs every 3 months; explained that I will call back if the team wants to repeat labs sooner to f/u on enzymes. Pt v/uz.     Also LVM on pt's wife's phone with this info.

## 2020-11-15 NOTE — Telephone Encounter
Reviewed enzyme elevated with Carole Civil - she advises repeat labs in 1 month.     Called pt & updated him with plans to repeat labs in 1 month. Confirmed that pt still has plenty of tacrolimus on hand; advised that the script was renewed today. Pt agreeable to plan and will repeat labs end of November. Extra lab orders faxed to [facility] clinic lab [phone].

## 2020-11-16 ENCOUNTER — Encounter: Admit: 2020-11-16 | Discharge: 2020-11-16 | Payer: MEDICARE

## 2020-12-05 ENCOUNTER — Encounter: Admit: 2020-12-05 | Discharge: 2020-12-05 | Payer: MEDICARE

## 2020-12-12 ENCOUNTER — Encounter: Admit: 2020-12-12 | Discharge: 2020-12-12 | Payer: MEDICARE

## 2020-12-12 NOTE — Progress Notes
Application for patient's Zortress has been submitted to prescriber for signatures.    Ricky Rhodes  Medication Assistance Coordinator   [phone]

## 2020-12-13 ENCOUNTER — Encounter: Admit: 2020-12-13 | Discharge: 2020-12-13 | Payer: MEDICARE

## 2020-12-16 ENCOUNTER — Encounter: Admit: 2020-12-16 | Discharge: 2020-12-16 | Payer: MEDICARE

## 2020-12-16 MED ORDER — EVEROLIMUS (IMMUNOSUPPRESSIVE) 0.5 MG PO TAB
2 mg | ORAL_TABLET | Freq: Two times a day (BID) | ORAL | 5 refills | Status: CN
Start: 2020-12-16 — End: ?

## 2020-12-16 NOTE — Telephone Encounter
Renewed script through [facility] spoke with pt's wife. See separate phone note.

## 2020-12-16 NOTE — Telephone Encounter
Ricky Rhodes called on behalf of the pt stating that they are having issues with refilling the pt anti rejection medication. Please call back.

## 2020-12-16 NOTE — Telephone Encounter
Returned call & spoke with pt's wife. Advised that Everolimus script was re-sent to [facility]. Reminded her that pt is due for repeat labs. She states that she has reminded pt, but she will remind him again.

## 2020-12-20 ENCOUNTER — Encounter: Admit: 2020-12-20 | Discharge: 2020-12-20 | Payer: MEDICARE

## 2020-12-20 DIAGNOSIS — Z944 Liver transplant status: Secondary | ICD-10-CM

## 2020-12-20 LAB — CBC AND DIFF
ABSOLUTE BASO COUNT: 0.1 uL (ref 0.0–0.1)
ABSOLUTE EOS COUNT: 0.5 uL (ref 0.0–0.5)
ABSOLUTE LYMPH COUNT: 2.5
ABSOLUTE MONO COUNT: 1.1 uL — ABNORMAL HIGH (ref 0.2–1.0)
ABSOLUTE NEUTROPHIL: 4.8 uL (ref 1.5–7.5)
BASOPHILS: 0.8 % (ref 0.0–1.0)
EOSINOPHIL %: 5.4 % (ref 0.0–6.0)
HEMATOCRIT: 46 % (ref 42–52)
HEMOGLOBIN: 15 g/dL (ref 14.0–18.0)
LYMPHOCYTES: 28 % (ref 20.0–40.0)
MCH: 27 pg (ref 27.0–31.0)
MCHC: 33 g/dL (ref 32.0–36.0)
MCV: 84 fl (ref 80–94)
MONOCYTES %: 12 % — ABNORMAL HIGH (ref 2.0–10.0)
MPV: 8.8 fl (ref 6.5–12.0)
NEUTROPHILS %: 52 % (ref 50.0–70.0)
PLATELET COUNT: 240 K/mm3 (ref 150–400)
RBC COUNT: 5.5 M/mm3 (ref 4.7–6.1)
RDW: 16 % — ABNORMAL HIGH (ref 11.5–14.5)
WBC COUNT: 9 K/mm3 (ref 4.8–10.8)

## 2020-12-26 ENCOUNTER — Encounter: Admit: 2020-12-26 | Discharge: 2020-12-26 | Payer: MEDICARE

## 2020-12-27 ENCOUNTER — Encounter: Admit: 2020-12-27 | Discharge: 2020-12-27 | Payer: MEDICARE

## 2020-12-27 MED FILL — TACROLIMUS 0.5 MG PO CAP: 0.5 mg | ORAL | 30 days supply | Qty: 120 | Fill #2 | Status: AC

## 2020-12-28 ENCOUNTER — Encounter: Admit: 2020-12-28 | Discharge: 2020-12-28 | Payer: MEDICARE

## 2020-12-28 NOTE — Telephone Encounter
Called [facility] - confirmed that CMP, GGTP, Everolimus, and tacro were all completed with 12/5 labs. Requested results be faxed to our office.

## 2020-12-28 NOTE — Telephone Encounter
Responded to patient on separate mychart message thread.

## 2020-12-29 ENCOUNTER — Encounter: Admit: 2020-12-29 | Discharge: 2020-12-29 | Payer: MEDICARE

## 2020-12-29 DIAGNOSIS — Z944 Liver transplant status: Principal | ICD-10-CM

## 2020-12-29 LAB — COMPREHENSIVE METABOLIC PANEL
ALBUMIN: 4.1 g/dL (ref 3.5–5.0)
ALK PHOSPHATASE: 81 U/L (ref 38–126)
ALT: 76 U/L — ABNORMAL HIGH (ref 0–50)
ANION GAP: 11 mmol/L (ref 6.0–18.0)
AST: 57 U/L — ABNORMAL HIGH (ref 14–50)
BLD UREA NITROGEN: 18 mg/dL (ref 9–20)
CALCIUM: 9 mg/dL (ref 8.0–10.2)
CHLORIDE: 110 mmol/L — ABNORMAL HIGH (ref 96–109)
CO2: 23 mmol/L (ref 22–30)
CREATININE: 1 mg/dL (ref 0.80–1.50)
EGFR: 76 mL/min
GLUCOSE,RANDOM: 125 mg/dL — ABNORMAL HIGH (ref 70–110)
POTASSIUM: 3.9 mmol/L (ref 3.5–5.0)
SODIUM: 140 mmol/L (ref 137–145)
TOTAL BILIRUBIN: 0.4 mg/dL (ref 0.2–1.3)
TOTAL PROTEIN: 8 g/dL (ref 6.3–8.2)

## 2020-12-29 LAB — GGTP: GGTP: 92 U/L — ABNORMAL HIGH (ref 15–73)

## 2020-12-29 LAB — TACROLIMUS LC-MS/MS: TACROLIMUS LC-MS/MS: 1.5 ng/mL — ABNORMAL LOW (ref 2.0–20.0)

## 2020-12-29 LAB — EVEROLIMUS BLOOD: EVEROLIMUS: 6.7 ng/mL (ref 3.0–8.0)

## 2020-12-29 NOTE — Telephone Encounter
Faxed extra lab orders for January to [facility] clinic lab [phone].

## 2020-12-29 NOTE — Telephone Encounter
Received lab results from 12/5 - enzymes a little more elevated. Attempted to call pt to again confirm any recent illness/new meds/other changes. No answer - LVM. Will review with team for follow-up plan.

## 2021-01-01 ENCOUNTER — Encounter: Admit: 2021-01-01 | Discharge: 2021-01-01 | Payer: MEDICARE

## 2021-01-06 ENCOUNTER — Encounter: Admit: 2021-01-06 | Discharge: 2021-01-06 | Payer: MEDICARE

## 2021-01-24 ENCOUNTER — Encounter: Admit: 2021-01-24 | Discharge: 2021-01-24 | Payer: MEDICARE

## 2021-01-25 MED FILL — TACROLIMUS 0.5 MG PO CAP: 0.5 mg | ORAL | 30 days supply | Qty: 120 | Fill #3 | Status: AC

## 2021-01-26 ENCOUNTER — Encounter: Admit: 2021-01-26 | Discharge: 2021-01-26 | Payer: MEDICARE

## 2021-01-26 DIAGNOSIS — Z944 Liver transplant status: Secondary | ICD-10-CM

## 2021-01-26 LAB — CBC AND DIFF
ABSOLUTE BASO COUNT: 0.1
ABSOLUTE EOS COUNT: 0.6 — ABNORMAL HIGH (ref 0.0–0.5)
ABSOLUTE LYMPH COUNT: 2.4
ABSOLUTE MONO COUNT: 0.8
ABSOLUTE NEUTROPHIL: 4.8
BASOPHILS: 0.8
EOSINOPHIL %: 7.1 — ABNORMAL HIGH (ref 0.0–6.0)
HEMATOCRIT: 45
HEMOGLOBIN: 15
LYMPHOCYTES: 27
MCH: 28
MCHC: 33
MCV: 84
MONOCYTES %: 9.2
MPV: 9.1
NEUTROPHILS %: 55
PLATELET COUNT: 262
RBC COUNT: 5.3
RDW: 15 — ABNORMAL HIGH (ref 11.5–14.5)
WBC COUNT: 8.7

## 2021-01-26 LAB — COMPREHENSIVE METABOLIC PANEL
ALBUMIN: 4.3
ALK PHOSPHATASE: 81
ALT: 60 — ABNORMAL HIGH (ref 0–50)
CO2: 21 — ABNORMAL LOW (ref 22–30)
POTASSIUM: 3.8
SODIUM: 142 mmol/L
TOTAL PROTEIN: 7.9

## 2021-01-26 LAB — GGTP: GGTP: 66 — ABNORMAL HIGH (ref 96–109)

## 2021-01-27 ENCOUNTER — Encounter: Admit: 2021-01-27 | Discharge: 2021-01-27 | Payer: MEDICARE

## 2021-01-27 NOTE — Progress Notes
An application has been submitted to [employer] for Zortress.      Alonia Dibuono  Medication Assistance Coordinator   [phone]

## 2021-01-31 ENCOUNTER — Encounter: Admit: 2021-01-31 | Discharge: 2021-01-31 | Payer: MEDICARE

## 2021-01-31 DIAGNOSIS — Z944 Liver transplant status: Secondary | ICD-10-CM

## 2021-01-31 LAB — EVEROLIMUS BLOOD: EVEROLIMUS: 5.6

## 2021-01-31 LAB — TACROLIMUS LC-MS/MS: TACROLIMUS LC-MS/MS: 1.4 — ABNORMAL LOW (ref 2.0–20.0)

## 2021-02-01 ENCOUNTER — Encounter: Admit: 2021-02-01 | Discharge: 2021-02-01 | Payer: MEDICARE

## 2021-02-03 ENCOUNTER — Encounter: Admit: 2021-02-03 | Discharge: 2021-02-03 | Payer: MEDICARE

## 2021-02-03 DIAGNOSIS — Z944 Liver transplant status: Secondary | ICD-10-CM

## 2021-02-03 MED ORDER — PREDNISONE 5 MG PO TAB
10 mg | ORAL_TABLET | Freq: Every day | ORAL | 1 refills | Status: AC
Start: 2021-02-03 — End: ?

## 2021-02-03 NOTE — Telephone Encounter
Spoke with pt's wife; explained plan to increase Prednisone to 10 mg daily and repeat labs in 2 weeks. She v/u. Faxed lab orders again to [facility] lab [phone].

## 2021-02-03 NOTE — Progress Notes
MEDICATION ASSISTANCE PROGRAM (MAP)  MANUFACTURER SUPPLIED MEDICATION    Drug: Zortress  Doctor, hospital Program: [employer]  Status: Approved  Enrollment Period: 01/27/2021-01/14/2022  MAP or Drug Company to [employer]: [employer]    Notes: Patient eligibility is based off insurance status and/or dx and patient must continue to meet all criteria to remain in the program. Please notify the MAP Program of any changes.     Peri Jefferson  Medication Assistance Coordinator   [phone]

## 2021-03-02 ENCOUNTER — Encounter: Admit: 2021-03-02 | Discharge: 2021-03-02 | Payer: MEDICARE

## 2021-03-03 ENCOUNTER — Encounter: Admit: 2021-03-03 | Discharge: 2021-03-03 | Payer: MEDICARE

## 2021-03-03 DIAGNOSIS — Z944 Liver transplant status: Secondary | ICD-10-CM

## 2021-03-03 LAB — COMPREHENSIVE METABOLIC PANEL
ALBUMIN: 4.2
ALK PHOSPHATASE: 79
ANION GAP: 13
AST: 48 — ABNORMAL HIGH (ref 2.0–10.0)
BLD UREA NITROGEN: 21 — ABNORMAL HIGH (ref 9–20)
CALCIUM: 8.8
CHLORIDE: 109
CO2: 20 — ABNORMAL LOW (ref 22–30)
CREATININE: 1.2 mg/dL — ABNORMAL HIGH (ref 11.5–14.5)
EGFR: 63
GLUCOSE,RANDOM: 140 — ABNORMAL HIGH (ref 70–110)
SODIUM: 139 mmol/L
TOTAL BILIRUBIN: 0.5 mg/dL
TOTAL PROTEIN: 7.7

## 2021-03-03 LAB — CBC AND DIFF
ABSOLUTE BASO COUNT: 0.1
ABSOLUTE EOS COUNT: 0.2
ABSOLUTE MONO COUNT: 1.1 — ABNORMAL HIGH (ref 0.2–1.0)
WBC COUNT: 9.9

## 2021-03-03 LAB — GGTP: GGTP: 81 — ABNORMAL HIGH (ref 15–73)

## 2021-03-06 ENCOUNTER — Encounter: Admit: 2021-03-06 | Discharge: 2021-03-06 | Payer: MEDICARE

## 2021-03-08 ENCOUNTER — Encounter: Admit: 2021-03-08 | Discharge: 2021-03-08 | Payer: MEDICARE

## 2021-03-08 DIAGNOSIS — Z944 Liver transplant status: Principal | ICD-10-CM

## 2021-03-08 LAB — EVEROLIMUS BLOOD: EVEROLIMUS: 7.3 ng/mL (ref 3.0–8.0)

## 2021-03-08 LAB — TACROLIMUS LC-MS/MS: TACROLIMUS LC-MS/MS: 2.2 ng/mL (ref 2.0–20.0)

## 2021-03-08 MED FILL — TACROLIMUS 0.5 MG PO CAP: 0.5 mg | ORAL | 30 days supply | Qty: 120 | Fill #4 | Status: AC

## 2021-03-09 ENCOUNTER — Encounter: Admit: 2021-03-09 | Discharge: 2021-03-09 | Payer: MEDICARE

## 2021-03-09 DIAGNOSIS — K862 Cyst of pancreas: Secondary | ICD-10-CM

## 2021-03-09 DIAGNOSIS — Z944 Liver transplant status: Secondary | ICD-10-CM

## 2021-03-10 ENCOUNTER — Encounter: Admit: 2021-03-10 | Discharge: 2021-03-10 | Payer: MEDICARE

## 2021-03-10 DIAGNOSIS — Z944 Liver transplant status: Secondary | ICD-10-CM

## 2021-03-10 NOTE — Telephone Encounter
Spoke with pt's wife - reviewed labs, liver enzymes improved. Instructed them to continue Prednisone 10 mg daily, no changes to everolimus or tacrolimus, and repeat labs mid-late March. Also explained that MRI was coordinated same day as July appts. Pt's wife v/u.    Faxed extra lab orders to [facility] lab [phone].

## 2021-04-10 ENCOUNTER — Encounter: Admit: 2021-04-10 | Discharge: 2021-04-10 | Payer: MEDICARE

## 2021-04-10 MED FILL — TACROLIMUS 0.5 MG PO CAP: 0.5 mg | ORAL | 30 days supply | Qty: 120 | Fill #5 | Status: AC

## 2021-04-18 ENCOUNTER — Encounter: Admit: 2021-04-18 | Discharge: 2021-04-18 | Payer: MEDICARE

## 2021-04-27 ENCOUNTER — Encounter: Admit: 2021-04-27 | Discharge: 2021-04-27 | Payer: MEDICARE

## 2021-04-28 ENCOUNTER — Encounter: Admit: 2021-04-28 | Discharge: 2021-04-28 | Payer: MEDICARE

## 2021-04-28 DIAGNOSIS — Z944 Liver transplant status: Secondary | ICD-10-CM

## 2021-04-28 LAB — COMPREHENSIVE METABOLIC PANEL
ALBUMIN: 4.1
ALK PHOSPHATASE: 79
ALT: 44
ANION GAP: 11 — ABNORMAL LOW (ref 27.0–31.0)
AST: 56 — ABNORMAL HIGH (ref 14–50)
BLD UREA NITROGEN: 21 — ABNORMAL HIGH (ref 9–20)
CALCIUM: 8.4
CO2: 22
CREATININE: 1.1 mg/dL — ABNORMAL HIGH (ref 11.5–14.5)
EGFR: 67
GLUCOSE,RANDOM: 112 — ABNORMAL HIGH (ref 70–110)
POTASSIUM: 3.5 — ABNORMAL HIGH (ref 15–73)
SODIUM: 138 mmol/L
TOTAL BILIRUBIN: 0.6 mg/dL
TOTAL PROTEIN: 7.3

## 2021-04-28 LAB — CBC AND DIFF
HEMATOCRIT: 45
WBC COUNT: 8.9

## 2021-05-05 ENCOUNTER — Encounter: Admit: 2021-05-05 | Discharge: 2021-05-05 | Payer: MEDICARE

## 2021-05-06 MED FILL — TACROLIMUS 0.5 MG PO CAP: 0.5 mg | ORAL | 30 days supply | Qty: 120 | Fill #6 | Status: AC

## 2021-05-08 ENCOUNTER — Encounter: Admit: 2021-05-08 | Discharge: 2021-05-08 | Payer: MEDICARE

## 2021-05-08 DIAGNOSIS — Z944 Liver transplant status: Secondary | ICD-10-CM

## 2021-05-08 LAB — EVEROLIMUS BLOOD: EVEROLIMUS: 6.9 ng/mL — ABNORMAL LOW (ref 8.0–8.0)

## 2021-05-09 ENCOUNTER — Encounter: Admit: 2021-05-09 | Discharge: 2021-05-09 | Payer: MEDICARE

## 2021-05-09 MED ORDER — OMEPRAZOLE 20 MG PO CPDR
20 mg | ORAL_CAPSULE | Freq: Every day | ORAL | 1 refills | Status: AC
Start: 2021-05-09 — End: ?

## 2021-06-05 ENCOUNTER — Encounter: Admit: 2021-06-05 | Discharge: 2021-06-05 | Payer: MEDICARE

## 2021-06-05 MED ORDER — METOPROLOL TARTRATE 25 MG PO TAB
12.5 mg | ORAL_TABLET | Freq: Two times a day (BID) | ORAL | 3 refills | 90.00000 days | Status: AC
Start: 2021-06-05 — End: ?

## 2021-06-05 MED FILL — TACROLIMUS 0.5 MG PO CAP: 0.5 mg | ORAL | 30 days supply | Qty: 120 | Fill #7 | Status: AC

## 2021-06-21 ENCOUNTER — Encounter: Admit: 2021-06-21 | Discharge: 2021-06-21 | Payer: MEDICARE

## 2021-06-23 ENCOUNTER — Encounter: Admit: 2021-06-23 | Discharge: 2021-06-23 | Payer: MEDICARE

## 2021-06-23 DIAGNOSIS — Z944 Liver transplant status: Secondary | ICD-10-CM

## 2021-06-23 MED ORDER — PREDNISONE 5 MG PO TAB
10 mg | ORAL_TABLET | Freq: Every day | ORAL | 1 refills | Status: AC
Start: 2021-06-23 — End: ?

## 2021-07-25 ENCOUNTER — Encounter: Admit: 2021-07-25 | Discharge: 2021-07-25 | Payer: MEDICARE

## 2021-07-25 MED ORDER — TACROLIMUS 0.5 MG PO CAP
1 mg | ORAL_CAPSULE | Freq: Two times a day (BID) | ORAL | 2 refills | 30.00000 days | Status: AC
Start: 2021-07-25 — End: ?

## 2021-07-31 ENCOUNTER — Ambulatory Visit: Admit: 2021-07-31 | Discharge: 2021-07-31 | Payer: MEDICARE

## 2021-07-31 ENCOUNTER — Encounter: Admit: 2021-07-31 | Discharge: 2021-07-31 | Payer: MEDICARE

## 2021-07-31 DIAGNOSIS — N200 Calculus of kidney: Secondary | ICD-10-CM

## 2021-07-31 DIAGNOSIS — Z944 Liver transplant status: Secondary | ICD-10-CM

## 2021-07-31 DIAGNOSIS — K7460 Unspecified cirrhosis of liver: Secondary | ICD-10-CM

## 2021-07-31 DIAGNOSIS — R519 Generalized headaches: Secondary | ICD-10-CM

## 2021-07-31 DIAGNOSIS — T148XXA Other injury of unspecified body region, initial encounter: Secondary | ICD-10-CM

## 2021-07-31 DIAGNOSIS — C801 Malignant (primary) neoplasm, unspecified: Secondary | ICD-10-CM

## 2021-07-31 DIAGNOSIS — E74818 Other disorders of glucose transport (HCC): Secondary | ICD-10-CM

## 2021-07-31 DIAGNOSIS — M25572 Pain in left ankle and joints of left foot: Secondary | ICD-10-CM

## 2021-07-31 DIAGNOSIS — M8589 Other specified disorders of bone density and structure, multiple sites: Secondary | ICD-10-CM

## 2021-07-31 DIAGNOSIS — K754 Autoimmune hepatitis: Secondary | ICD-10-CM

## 2021-07-31 DIAGNOSIS — E559 Vitamin D deficiency, unspecified: Secondary | ICD-10-CM

## 2021-07-31 DIAGNOSIS — K769 Liver disease, unspecified: Secondary | ICD-10-CM

## 2021-07-31 DIAGNOSIS — A0472 Enterocolitis due to Clostridium difficile, not specified as recurrent: Secondary | ICD-10-CM

## 2021-07-31 DIAGNOSIS — K862 Cyst of pancreas: Secondary | ICD-10-CM

## 2021-07-31 DIAGNOSIS — D849 Immunodeficiency, unspecified: Secondary | ICD-10-CM

## 2021-07-31 DIAGNOSIS — IMO0002 Squamous cell carcinoma: Secondary | ICD-10-CM

## 2021-07-31 DIAGNOSIS — I8500 Esophageal varices without bleeding: Secondary | ICD-10-CM

## 2021-07-31 DIAGNOSIS — C4492 Squamous cell carcinoma of skin, unspecified: Secondary | ICD-10-CM

## 2021-07-31 LAB — CBC AND DIFF
ABSOLUTE BASO COUNT: 0 K/UL (ref 0–0.20)
ABSOLUTE EOS COUNT: 0.1 K/UL (ref 0–0.45)
ABSOLUTE LYMPH COUNT: 2.5 K/UL (ref 1.0–4.8)
ABSOLUTE MONO COUNT: 1 K/UL — ABNORMAL HIGH (ref 0–0.80)
ABSOLUTE NEUTROPHIL: 6.6 K/UL (ref 1.8–7.0)
BASOPHILS %: 1 % (ref 0–2)
EOSINOPHILS %: 2 % (ref >60)
HEMATOCRIT: 46 % (ref 40–50)
HEMOGLOBIN: 15 g/dL (ref 13.5–16.5)
LYMPHOCYTES %: 24 % (ref 24–44)
MCH: 27 pg (ref 26–34)
MCHC: 32 g/dL (ref 32.0–36.0)
MCV: 84 FL (ref 80–100)
MONOCYTES %: 10 % (ref 4–12)
MPV: 9.3 FL (ref 7–11)
NEUTROPHILS %: 63 % (ref 41–77)
PLATELET COUNT: 230 K/UL (ref 150–400)
RBC COUNT: 5.5 M/UL — ABNORMAL HIGH (ref 4.4–5.5)
RDW: 16 % — ABNORMAL HIGH (ref 11–15)
WBC COUNT: 10 K/UL (ref 4.5–11.0)

## 2021-07-31 LAB — FK506 TACROLIMUS: TACROLIMUS: 2.1 ng/mL — CL (ref 5.0–15.0)

## 2021-07-31 LAB — GGTP: GGTP: 49 U/L (ref 9–64)

## 2021-07-31 LAB — COMPREHENSIVE METABOLIC PANEL
POTASSIUM: 3.6 MMOL/L (ref 3.5–5.1)
SODIUM: 139 MMOL/L (ref 137–147)

## 2021-07-31 MED ORDER — GADOBENATE DIMEGLUMINE 529 MG/ML (0.1MMOL/0.2ML) IV SOLN
18 mL | Freq: Once | INTRAVENOUS | 0 refills | Status: CP
Start: 2021-07-31 — End: ?
  Administered 2021-07-31: 18:00:00 18 mL via INTRAVENOUS

## 2021-07-31 MED ORDER — PREDNISONE 5 MG PO TAB
5 mg | ORAL_TABLET | Freq: Every day | ORAL | 3 refills | Status: AC
Start: 2021-07-31 — End: ?

## 2021-07-31 MED ORDER — SODIUM CHLORIDE 0.9 % IJ SOLN
25 mL | Freq: Once | INTRAVENOUS | 0 refills | Status: CP
Start: 2021-07-31 — End: ?
  Administered 2021-07-31: 18:00:00 25 mL via INTRAVENOUS

## 2021-07-31 NOTE — Telephone Encounter
--  CRITICAL LABS -- xfrd to [employer] [facility]

## 2021-07-31 NOTE — Telephone Encounter
[facility] lab re: Fk 2.1.  Expected result, within goal 2-4.   Remaining labs stable, Everolimus pending.  Markus Jarvis RN BSN

## 2021-07-31 NOTE — Patient Instructions
I will follow up with you after vitamin D and when I hear what your steroid plan is     increase your calcium    we will plan on xrays of your ankle     Nurse extension: [phone]  Clinic number: [phone] scheduling   Infusion center: [phone]  Radiology scheduling number: [phone]

## 2021-07-31 NOTE — Progress Notes
Shaun Runyon   Bone density 07/31/2021   L1-L4 Z score -1.6  compared to 2011 there was a 1.8% change   total  mean Z score is -0.7   All Z scores of the hip are more positive than -2.0   this is a -4.5% change from 2011   33% radisum Z score is -0.2   this isa  10.8% change from 2011     Overall this bone density is normal.
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
## 2021-08-10 ENCOUNTER — Encounter: Admit: 2021-08-10 | Discharge: 2021-08-10 | Payer: MEDICARE

## 2021-08-10 NOTE — Telephone Encounter
Returned call. Pt works in [employer] and co-worker has worms. Wife was asking if we need to manage medication for that or if it's okay to go to PCP. NC approved working with PCP. Wife verbalized understanding.

## 2021-08-10 NOTE — Telephone Encounter
Leveda Anna called on behalf of the pt. No details were disclosed. Please call back.

## 2021-09-22 ENCOUNTER — Encounter: Admit: 2021-09-22 | Discharge: 2021-09-22 | Payer: MEDICARE

## 2021-09-25 ENCOUNTER — Encounter: Admit: 2021-09-25 | Discharge: 2021-09-25 | Payer: MEDICARE

## 2021-09-25 MED ORDER — EVEROLIMUS (IMMUNOSUPPRESSIVE) 0.5 MG PO TAB
2 mg | ORAL_TABLET | Freq: Two times a day (BID) | ORAL | 5 refills | 30.00000 days | Status: AC
Start: 2021-09-25 — End: ?

## 2021-10-12 ENCOUNTER — Encounter: Admit: 2021-10-12 | Discharge: 2021-10-12 | Payer: MEDICARE

## 2021-10-18 ENCOUNTER — Encounter: Admit: 2021-10-18 | Discharge: 2021-10-18 | Payer: MEDICARE

## 2021-10-18 MED ORDER — TACROLIMUS 1 MG PO CAP
ORAL_CAPSULE | ORAL | 0 refills | 30.00000 days | Status: AC
Start: 2021-10-18 — End: ?

## 2021-10-19 ENCOUNTER — Encounter: Admit: 2021-10-19 | Discharge: 2021-10-19 | Payer: MEDICARE

## 2021-10-19 MED ORDER — PRAVASTATIN 10 MG PO TAB
10 mg | ORAL_TABLET | Freq: Every evening | ORAL | 2 refills | 90.00000 days | Status: AC
Start: 2021-10-19 — End: ?

## 2021-10-23 ENCOUNTER — Encounter: Admit: 2021-10-23 | Discharge: 2021-10-23 | Payer: MEDICARE | Primary: Family

## 2021-10-23 NOTE — Telephone Encounter
Called PCP's office- Aleene Davidson, APRN and spoke with Jacqlyn Larsen, Therapist, sports. Let Jacqlyn Larsen know that Dr. Con Memos would prefer [facility] manage pt's statin medication rather than our office. Jacqlyn Larsen confirmed that was acceptable and asked about a recent lipid panel. This NC stated we have one ordered but pt needs labs in the next few weeks. Becky confirmed fax number and stated we could send labs to their office.    Labs faxed to [facility], APRN.

## 2021-11-09 ENCOUNTER — Encounter: Admit: 2021-11-09 | Discharge: 2021-11-09 | Payer: MEDICARE | Primary: Family

## 2021-11-09 DIAGNOSIS — D849 Immunodeficiency, unspecified: Secondary | ICD-10-CM

## 2021-11-09 DIAGNOSIS — Z944 Liver transplant status: Secondary | ICD-10-CM

## 2021-11-09 LAB — MAGNESIUM: MAGNESIUM: 1.8 mg/dL (ref 1.6–2.3)

## 2021-11-09 LAB — LIPID PROFILE
CHOLESTEROL/HDL %: 4.9
CHOLESTEROL: 233 — ABNORMAL HIGH (ref <200)
HDL: 48
LDL: 142 — ABNORMAL HIGH
NON HDL CHOLESTEROL: 185 — ABNORMAL HIGH (ref <130)
TRIGLYCERIDES: 304 mg/dL — ABNORMAL HIGH (ref <150)

## 2021-11-09 LAB — CBC AND DIFF
ABSOLUTE NEUTROPHIL: 5.6 (ref 1.39–8.0)
BASOPHILS: 0.1 % (ref 0.1–0.9)
EOSINOPHIL %: 3.7 % (ref 0.7–8.2)
HEMATOCRIT: 45 % (ref 32.70–47.30)
HEMOGLOBIN: 14 g/dL (ref 10.50–18.20)
LYMPHOCYTES: 22 % (ref 13.7–41.4)
MCH: 26 pg — ABNORMAL LOW (ref 27.10–34.00)
MCHC: 31 g/dL — ABNORMAL LOW (ref 31.70–35.10)
MCV: 84 fL (ref 82.10–98.20)
MONOCYTES %: 12 % — ABNORMAL HIGH (ref 4.4–12.4)
MPV: 10 fL (ref 7.2–12.4)
NEUTROPHILS %: 59 % (ref 38.2–80.8)
PLATELET COUNT: 260 x10E3/ul (ref 150–393)
RBC COUNT: 5.3 x10E6/ul (ref 3.33–5.49)
RDW: 15
WBC COUNT: 9.4 x10E3/ul (ref 3.64–10.7)

## 2021-11-09 LAB — PHOSPHORUS: PHOSPHORUS: 3.1 mg/dL

## 2021-11-09 LAB — TACROLIMUS LC-MS/MS: TACROLIMUS LC-MS/MS: 1.3 ug/L — ABNORMAL LOW (ref 5.0–20.0)

## 2021-11-09 LAB — EVEROLIMUS BLOOD: EVEROLIMUS: 4.3

## 2021-11-09 MED ORDER — OMEPRAZOLE 20 MG PO CPDR
20 mg | ORAL_CAPSULE | Freq: Every day | ORAL | 1 refills | Status: AC
Start: 2021-11-09 — End: ?

## 2021-11-20 ENCOUNTER — Encounter: Admit: 2021-11-20 | Discharge: 2021-11-20 | Payer: MEDICARE | Primary: Family

## 2021-11-20 NOTE — Progress Notes
Application for patient's Zortress has been submitted to prescriber for signatures.    Peri Jefferson  Medication Assistance Coordinator   [phone]

## 2021-12-28 ENCOUNTER — Encounter: Admit: 2021-12-28 | Discharge: 2021-12-28 | Payer: MEDICARE | Primary: Family

## 2021-12-28 MED ORDER — EVEROLIMUS (IMMUNOSUPPRESSIVE) 0.5 MG PO TAB
2 mg | ORAL_TABLET | Freq: Two times a day (BID) | ORAL | 0 refills | 30.00000 days | Status: AC
Start: 2021-12-28 — End: ?

## 2022-01-12 ENCOUNTER — Encounter: Admit: 2022-01-12 | Discharge: 2022-01-12 | Payer: MEDICARE | Primary: Family

## 2022-01-30 ENCOUNTER — Encounter: Admit: 2022-01-30 | Discharge: 2022-01-30 | Payer: MEDICARE | Primary: Family
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
## 2022-01-30 NOTE — Telephone Encounter
Spoke to wife. She gave me contact information of patient assistance program at [place] that we need to talk to. This NC stated I would send them a MyChart message once prescription was sent. Wife verbalized understanding.      LVM for Brandi at [facility]. Asked for a call back to reconcile situation for patient.

## 2022-01-30 NOTE — Telephone Encounter
Left a 2nd VM on patient assistance VM. Left fax number and call back number to try and get patient's medication cleared today. Asked for fax and return call.

## 2022-01-30 NOTE — Telephone Encounter
Leveda Anna called pt almost out of his zortras and they need a doctor signature for [employer]. Transferred to [place].

## 2022-02-01 ENCOUNTER — Encounter: Admit: 2022-02-01 | Discharge: 2022-02-01 | Payer: MEDICARE | Primary: Family

## 2022-02-02 ENCOUNTER — Encounter: Admit: 2022-02-02 | Discharge: 2022-02-02 | Payer: MEDICARE | Primary: Family

## 2022-02-02 MED ORDER — PRAVASTATIN 10 MG PO TAB
10 mg | ORAL_TABLET | Freq: Every evening | ORAL | 2 refills | 90.00000 days | Status: AC
Start: 2022-02-02 — End: ?

## 2022-02-14 ENCOUNTER — Encounter: Admit: 2022-02-14 | Discharge: 2022-02-14 | Payer: MEDICARE | Primary: Family

## 2022-03-01 ENCOUNTER — Encounter: Admit: 2022-03-01 | Discharge: 2022-03-01 | Payer: MEDICARE | Primary: Family

## 2022-03-02 ENCOUNTER — Encounter: Admit: 2022-03-02 | Discharge: 2022-03-02 | Payer: MEDICARE | Primary: Family

## 2022-03-02 NOTE — Progress Notes
MEDICATION ASSISTANCE PROGRAM (MAP)  MANUFACTURER SUPPLIED MEDICATION    Drug: Zortress  Doctor, hospital Program: [employer]  Status: Approved  Enrollment Period: 02/22/2022-01/15/2023  MAP or Drug Company to [employer]: Drug company  Refill Info: Pt to call [phone] for refills    Notes: Patient eligibility is based off insurance status and/or dx and patient must continue to meet all criteria to remain in the program. Please notify the MAP Program of any changes.     Allayne Stack  Medication Assistance Coordinator  [phone]

## 2022-03-05 ENCOUNTER — Encounter: Admit: 2022-03-05 | Discharge: 2022-03-05 | Payer: MEDICARE | Primary: Family

## 2022-03-06 ENCOUNTER — Encounter: Admit: 2022-03-06 | Discharge: 2022-03-06 | Payer: MEDICARE | Primary: Family

## 2022-03-06 DIAGNOSIS — D849 Immunodeficiency, unspecified: Secondary | ICD-10-CM

## 2022-03-06 DIAGNOSIS — Z944 Liver transplant status: Secondary | ICD-10-CM

## 2022-03-06 LAB — CBC AND DIFF
ABSOLUTE NEUTROPHIL: 6
BASOPHILS: 0.3 %
EOSINOPHIL %: 3.7 % (ref 0.7–8.2)
HEMATOCRIT: 45 % (ref 32.70–47.30)
HEMOGLOBIN: 14 g/dL (ref 10.50–16.20)
LYMPHOCYTES: 27 % (ref 13.7–41.4)
MCHC: 31 g/dL (ref 31.70–35.10)
MCV: 83
MONOCYTES %: 12 %
MPV: 10 fL (ref 7.2–12.4)
NEUTROPHILS %: 56 % (ref 38.2–80.6)
RBC COUNT: 5.4
RDW: 15
WBC COUNT: 10

## 2022-03-06 LAB — MAGNESIUM: MAGNESIUM: 1.6 mg/dL (ref 1.6–2.3)

## 2022-03-06 LAB — COMPREHENSIVE METABOLIC PANEL
ALBUMIN: 3.8 g/dL (ref 3.5–5.0)
ALK PHOSPHATASE: 110 U/L (ref 38–126)
ALT: 50 U/L — ABNORMAL HIGH (ref 0–49)
ANION GAP: 13
AST: 40 U/L (ref 17–59)
BLD UREA NITROGEN: 20
CALCIUM: 9.5 mg/dL (ref 8.4–10.2)
CHLORIDE: 108 mmol/L — ABNORMAL HIGH (ref 98–107)
CO2: 21 mmol/L — ABNORMAL LOW (ref 22–30)
CREATININE: 1.1 mg/dL (ref 0.66–1.25)
EGFR: 60
GLUCOSE,RANDOM: 213 — ABNORMAL HIGH (ref 74.0–106.0)
POTASSIUM: 3.4 mmol/L — ABNORMAL LOW (ref 3.5–5.1)
SODIUM: 139 mmol/L (ref 134–144)
TOTAL BILIRUBIN: 0.5 mg/dL (ref 0.2–1.3)
TOTAL PROTEIN: 7.6 g/dL

## 2022-03-06 LAB — PHOSPHORUS: PHOSPHORUS: 3 pg — ABNORMAL LOW (ref 2.5–4.5)

## 2022-03-12 ENCOUNTER — Encounter: Admit: 2022-03-12 | Discharge: 2022-03-12 | Payer: MEDICARE | Primary: Family

## 2022-03-12 DIAGNOSIS — Z944 Liver transplant status: Secondary | ICD-10-CM

## 2022-03-12 DIAGNOSIS — D849 Immunodeficiency, unspecified: Secondary | ICD-10-CM

## 2022-03-12 LAB — EVEROLIMUS BLOOD: EVEROLIMUS: 6.6 ng/mL

## 2022-03-12 LAB — TACROLIMUS LC-MS/MS: TACROLIMUS LC-MS/MS: 1.3 ug/L — ABNORMAL LOW (ref 5.0–20.0)

## 2022-04-15 ENCOUNTER — Encounter: Admit: 2022-04-15 | Discharge: 2022-04-15 | Payer: MEDICARE | Primary: Family

## 2022-04-15 MED ORDER — TACROLIMUS 1 MG PO CAP
ORAL_CAPSULE | 1 refills
Start: 2022-04-15 — End: ?

## 2022-04-24 ENCOUNTER — Encounter: Admit: 2022-04-24 | Discharge: 2022-04-24 | Payer: MEDICARE | Primary: Family

## 2022-04-30 ENCOUNTER — Encounter: Admit: 2022-04-30 | Discharge: 2022-04-30 | Payer: MEDICARE | Primary: Family

## 2022-04-30 DIAGNOSIS — Z944 Liver transplant status: Secondary | ICD-10-CM

## 2022-04-30 DIAGNOSIS — K862 Cyst of pancreas: Secondary | ICD-10-CM

## 2022-05-01 ENCOUNTER — Encounter: Admit: 2022-05-01 | Discharge: 2022-05-01 | Payer: MEDICARE | Primary: Family

## 2022-05-04 ENCOUNTER — Encounter: Admit: 2022-05-04 | Discharge: 2022-05-04 | Payer: MEDICARE | Primary: Family

## 2022-05-13 ENCOUNTER — Encounter: Admit: 2022-05-13 | Discharge: 2022-05-13 | Payer: MEDICARE | Primary: Family

## 2022-05-13 DIAGNOSIS — Z944 Liver transplant status: Secondary | ICD-10-CM

## 2022-05-13 DIAGNOSIS — D849 Immunodeficiency, unspecified: Secondary | ICD-10-CM

## 2022-05-13 LAB — CBC AND DIFF
ABSOLUTE BASO COUNT: 0 x10E3/uL (ref 0.0–0.10)
ABSOLUTE EOS COUNT: 0.2 x10E3/ul (ref 0.0–0.5)
ABSOLUTE LYMPH COUNT: 2.4 x10E3/ul (ref 0.90–3.00)
ABSOLUTE MONO COUNT: 1.3 x10E3/ul — ABNORMAL HIGH (ref 0.33–0.71)
ABSOLUTE NEUTROPHIL: 6.9 x10E3/ul (ref 1.39–8.00)
BASOPHILS: 0.3 % (ref 0.1–0.9)
EOSINOPHIL %: 1.8 % (ref 0.7–8.2)
HEMATOCRIT: 45 % (ref 32.70–47.30)
HEMOGLOBIN: 14 g/dL (ref 10.50–16.20)
MCH: 27 pg — ABNORMAL LOW (ref 27.10–34.00)
MCHC: 31 g/dL — ABNORMAL LOW (ref 31.70–35.10)
MCV: 86 fL (ref 82.10–98.20)
MONOCYTES %: 11 % (ref 4.4–12.4)
MPV: 11 fL (ref 7.2–12.4)
NEUTROPHILS %: 63 % (ref 38.2–80.6)
PLATELET COUNT: 273 x10E3/ul (ref 150–393)
RBC COUNT: 5.2 x10E6/ul — ABNORMAL LOW (ref 3.33–5.49)
RDW: 14 mg/dL (ref 8.4–10.2)
WBC COUNT: 10 x10E3/ul — ABNORMAL HIGH (ref 3.64–10.7)

## 2022-05-13 LAB — COMPREHENSIVE METABOLIC PANEL
POTASSIUM: 4.1 mmol/L (ref 3.5–5.1)
SODIUM: 137 mmol/L (ref 134–144)

## 2022-05-13 LAB — PHOSPHORUS: PHOSPHORUS: 3.1 mg/dL (ref 2.5–4.5)

## 2022-05-13 LAB — MAGNESIUM: MAGNESIUM: 1.6 mg/dL (ref 1.6–2.3)

## 2022-05-16 ENCOUNTER — Encounter: Admit: 2022-05-16 | Discharge: 2022-05-16 | Payer: MEDICARE | Primary: Family

## 2022-05-16 DIAGNOSIS — Z944 Liver transplant status: Secondary | ICD-10-CM

## 2022-05-16 DIAGNOSIS — D849 Immunodeficiency, unspecified: Secondary | ICD-10-CM

## 2022-05-16 LAB — TACROLIMUS LC-MS/MS: TACROLIMUS LC-MS/MS: 1.6 ug/L — ABNORMAL LOW (ref 5.0–20.0)

## 2022-05-24 ENCOUNTER — Encounter: Admit: 2022-05-24 | Discharge: 2022-05-24 | Payer: MEDICARE | Primary: Family

## 2022-05-24 MED ORDER — METOPROLOL TARTRATE 25 MG PO TAB
ORAL_TABLET | ORAL | 3 refills | 90.00000 days | Status: AC
Start: 2022-05-24 — End: ?

## 2022-05-24 MED ORDER — OMEPRAZOLE 20 MG PO CPDR
20 mg | ORAL_CAPSULE | Freq: Every day | ORAL | 1 refills | Status: AC
Start: 2022-05-24 — End: ?

## 2022-05-28 ENCOUNTER — Encounter: Admit: 2022-05-28 | Discharge: 2022-05-28 | Payer: MEDICARE | Primary: Family

## 2022-05-28 NOTE — Telephone Encounter
Ricky Rhodes with [facility] called in requesting a return call regarding labs orders for Pt. Please return call.

## 2022-05-28 NOTE — Telephone Encounter
NC attempted to contact [facility]. No answer. NC LVM with call back number.

## 2022-05-28 NOTE — Telephone Encounter
Tammy called returning Lindsay's call. Transferred to [place].

## 2022-05-28 NOTE — Telephone Encounter
NC requested everolimus result to be refaxed to [phone]

## 2022-05-29 ENCOUNTER — Encounter: Admit: 2022-05-29 | Discharge: 2022-05-29 | Payer: MEDICARE | Primary: Family

## 2022-05-29 DIAGNOSIS — Z944 Liver transplant status: Secondary | ICD-10-CM

## 2022-05-29 DIAGNOSIS — D849 Immunodeficiency, unspecified: Secondary | ICD-10-CM

## 2022-05-29 LAB — EVEROLIMUS BLOOD: EVEROLIMUS: 7.2

## 2022-06-07 ENCOUNTER — Encounter: Admit: 2022-06-07 | Discharge: 2022-06-07 | Payer: MEDICARE | Primary: Family

## 2022-06-07 NOTE — Telephone Encounter
NC was contacted by patients spouse. Patient had seen PCP today for a stomach virus and had hemoglobin A1C checked. A1C was 8.6. Patient is being started on Metformin. NC asked that patient's spouse send a message with the dose. Patient's spouse stated that PCP would be sending ov note to clinic for review.

## 2022-06-07 NOTE — Telephone Encounter
Pt called regarding results of finding out being diabetic. Transferred to [place].

## 2022-07-02 ENCOUNTER — Encounter: Admit: 2022-07-02 | Discharge: 2022-07-02 | Payer: MEDICARE | Primary: Family

## 2022-07-18 ENCOUNTER — Encounter: Admit: 2022-07-18 | Discharge: 2022-07-18 | Payer: MEDICARE | Primary: Family

## 2022-07-18 DIAGNOSIS — Z944 Liver transplant status: Secondary | ICD-10-CM

## 2022-07-18 MED ORDER — PREDNISONE 5 MG PO TAB
5 mg | ORAL_TABLET | Freq: Every day | ORAL | 3 refills | Status: AC
Start: 2022-07-18 — End: ?

## 2022-07-23 ENCOUNTER — Encounter: Admit: 2022-07-23 | Discharge: 2022-07-23 | Payer: MEDICARE | Primary: Family

## 2022-07-31 ENCOUNTER — Encounter: Admit: 2022-07-31 | Discharge: 2022-07-31 | Payer: MEDICARE | Primary: Family

## 2022-07-31 DIAGNOSIS — D849 Immunodeficiency, unspecified: Secondary | ICD-10-CM

## 2022-07-31 DIAGNOSIS — Z944 Liver transplant status: Secondary | ICD-10-CM

## 2022-07-31 LAB — CBC AND DIFF
HEMATOCRIT: 43 % — ABNORMAL HIGH (ref 32.70–47.30)
HEMOGLOBIN: 13 g/dL (ref 10.50–16.20)
MCH: 26 pg — ABNORMAL LOW (ref 27.10–34.00)
MCHC: 31 g/dL — ABNORMAL LOW (ref 31.70–35.10)
MCV: 84 fL (ref 82.10–98.20)
PLATELET COUNT: 276 10*3/uL (ref 150–393)
RBC COUNT: 5.1 x10E6/ul (ref 3.33–5.49)
RDW: 15 % (ref 11.5–15.7)
WBC COUNT: 10 10*3/uL (ref 3.64–10.7)

## 2022-08-01 ENCOUNTER — Encounter: Admit: 2022-08-01 | Discharge: 2022-08-01 | Payer: MEDICARE | Primary: Family

## 2022-08-01 DIAGNOSIS — Z944 Liver transplant status: Secondary | ICD-10-CM

## 2022-08-01 DIAGNOSIS — D849 Immunodeficiency, unspecified: Secondary | ICD-10-CM

## 2022-08-01 LAB — COMPREHENSIVE METABOLIC PANEL
ALBUMIN: 4.6 g/dL
ALK PHOSPHATASE: 89
ALT: 30
ANION GAP: 12
AST: 31
CALCIUM: 9.3 mg/dL (ref 8.4–10.2)
CHLORIDE: 110 mmol/L — ABNORMAL HIGH (ref 98–107)
CO2: 21 mmol/L — ABNORMAL LOW (ref 22–30)
CREATININE: 1.1 mg/dL (ref 0.66–1.25)
EGFR: >60 mL/min/{1.73_m2}
GLUCOSE,RANDOM: 104 mg/dL (ref 74.0–106.0)
SODIUM: 140 mmol/L (ref 134–144)
TOTAL BILIRUBIN: 0.5 mg/dL
TOTAL PROTEIN: 7.8

## 2022-08-01 LAB — MAGNESIUM: MAGNESIUM: 1.8 mg/dL (ref 1.6–2.3)

## 2022-08-01 LAB — PHOSPHORUS: PHOSPHORUS: 3.5 mg/dL (ref 9–20)

## 2022-08-02 ENCOUNTER — Encounter: Admit: 2022-08-02 | Discharge: 2022-08-02 | Payer: MEDICARE | Primary: Family

## 2022-08-02 DIAGNOSIS — Z944 Liver transplant status: Secondary | ICD-10-CM

## 2022-08-03 ENCOUNTER — Ambulatory Visit: Admit: 2022-08-03 | Discharge: 2022-08-03 | Payer: MEDICARE | Primary: Family

## 2022-08-03 ENCOUNTER — Encounter: Admit: 2022-08-03 | Discharge: 2022-08-03 | Payer: MEDICARE | Primary: Family

## 2022-08-03 DIAGNOSIS — R519 Generalized headaches: Secondary | ICD-10-CM

## 2022-08-03 DIAGNOSIS — T148XXA Other injury of unspecified body region, initial encounter: Secondary | ICD-10-CM

## 2022-08-03 DIAGNOSIS — A0472 Enterocolitis due to Clostridium difficile, not specified as recurrent: Secondary | ICD-10-CM

## 2022-08-03 DIAGNOSIS — I8500 Esophageal varices without bleeding: Secondary | ICD-10-CM

## 2022-08-03 DIAGNOSIS — N200 Calculus of kidney: Secondary | ICD-10-CM

## 2022-08-03 DIAGNOSIS — K862 Cyst of pancreas: Secondary | ICD-10-CM

## 2022-08-03 DIAGNOSIS — K7460 Unspecified cirrhosis of liver: Secondary | ICD-10-CM

## 2022-08-03 DIAGNOSIS — K754 Autoimmune hepatitis: Secondary | ICD-10-CM

## 2022-08-03 DIAGNOSIS — Z944 Liver transplant status: Secondary | ICD-10-CM

## 2022-08-03 DIAGNOSIS — C801 Malignant (primary) neoplasm, unspecified: Secondary | ICD-10-CM

## 2022-08-03 DIAGNOSIS — IMO0002 Squamous cell carcinoma: Secondary | ICD-10-CM

## 2022-08-03 DIAGNOSIS — K769 Liver disease, unspecified: Secondary | ICD-10-CM

## 2022-08-03 MED ORDER — GADOBENATE DIMEGLUMINE 529 MG/ML (0.1MMOL/0.2ML) IV SOLN
19 mL | Freq: Once | INTRAVENOUS | 0 refills | Status: CP
Start: 2022-08-03 — End: ?
  Administered 2022-08-03: 14:00:00 19 mL via INTRAVENOUS

## 2022-08-03 MED ORDER — SODIUM CHLORIDE 0.9 % IJ SOLN
50 mL | Freq: Once | INTRAVENOUS | 0 refills | Status: CP
Start: 2022-08-03 — End: ?
  Administered 2022-08-03: 14:00:00 50 mL via INTRAVENOUS

## 2022-08-04 ENCOUNTER — Encounter: Admit: 2022-08-04 | Discharge: 2022-08-04 | Payer: MEDICARE | Primary: Family

## 2022-08-04 DIAGNOSIS — Z944 Liver transplant status: Secondary | ICD-10-CM

## 2022-08-04 LAB — TACROLIMUS LC-MS/MS: TACROLIMUS LC-MS/MS: 1.4 — ABNORMAL LOW (ref 5.0–20.0)

## 2022-08-08 ENCOUNTER — Encounter: Admit: 2022-08-08 | Discharge: 2022-08-08 | Payer: MEDICARE | Primary: Family

## 2022-08-08 DIAGNOSIS — T8649 Other complications of liver transplant: Secondary | ICD-10-CM

## 2022-08-08 DIAGNOSIS — T8640 Unspecified complication of liver transplant: Secondary | ICD-10-CM

## 2022-08-08 DIAGNOSIS — Z944 Liver transplant status: Secondary | ICD-10-CM

## 2022-08-08 DIAGNOSIS — D849 Immunodeficiency, unspecified: Secondary | ICD-10-CM

## 2022-08-08 NOTE — Telephone Encounter
Faxed lab orders to Kriste Basque, Charity fundraiser at PCP office and [facility] [phone]. Due q34mo.

## 2022-08-13 ENCOUNTER — Encounter: Admit: 2022-08-13 | Discharge: 2022-08-13 | Payer: MEDICARE | Primary: Family

## 2022-08-13 DIAGNOSIS — Z944 Liver transplant status: Secondary | ICD-10-CM

## 2022-08-13 LAB — EVEROLIMUS BLOOD: EVEROLIMUS: 11 g/dL (ref 32.0–36.0)

## 2022-09-04 ENCOUNTER — Encounter: Admit: 2022-09-04 | Discharge: 2022-09-04 | Payer: MEDICARE | Primary: Family

## 2022-09-04 NOTE — Telephone Encounter
Lennox Laity called no reason given just requesting a call back. [phone]. Thank you

## 2022-09-05 NOTE — Telephone Encounter
Left message for Ricky Rhodes to return call.  Shawn Stall RN BSN

## 2022-09-10 ENCOUNTER — Encounter: Admit: 2022-09-10 | Discharge: 2022-09-10 | Payer: MEDICARE | Primary: Family

## 2022-09-10 NOTE — Telephone Encounter
Ricky Rhodes from [facility] called and wanted to prescribe Acyclovir for shingles. Ricky Rhodes called to confirm that was acceptable for patient to take. NC confirmed patient can take Acyclovir. Keisha V/U and had no further questions.

## 2022-09-10 NOTE — Telephone Encounter
[name] calling from a walk in clinic in [place] have a question about a medication before they give to patient. Patient is at Clinic now. Call transferred to [facility]. Thank you

## 2022-09-10 NOTE — Telephone Encounter
Spoke with Lennox Laity. She is asking what will happen with pt's immunosuppression regimen now that [employer] is getting rid of patient assistance program for Zortress. She states they are not able to afford Zortress for $180 per week through their local pharmacy. Advised that providers will be notified and they will give recommendations for immunosuppression plan and we will follow up with pt. Lennox Laity verbalized understanding and did not have any other questions or concerns at this time.

## 2022-09-10 NOTE — Telephone Encounter
Lennox Laity called regarding medication program discontinuing and asking what they should do. Please call back.

## 2022-09-27 ENCOUNTER — Encounter: Admit: 2022-09-27 | Discharge: 2022-09-27 | Payer: MEDICARE | Primary: Family

## 2022-10-04 ENCOUNTER — Encounter: Admit: 2022-10-04 | Discharge: 2022-10-04 | Payer: MEDICARE | Primary: Family

## 2022-10-04 ENCOUNTER — Ambulatory Visit: Admit: 2022-10-04 | Discharge: 2022-10-04 | Payer: MEDICARE | Primary: Family

## 2022-10-04 ENCOUNTER — Ambulatory Visit: Admit: 2022-10-04 | Discharge: 2022-10-05 | Payer: MEDICARE | Primary: Family

## 2022-10-04 DIAGNOSIS — K7460 Unspecified cirrhosis of liver: Secondary | ICD-10-CM

## 2022-10-04 DIAGNOSIS — Z944 Liver transplant status: Secondary | ICD-10-CM

## 2022-10-04 DIAGNOSIS — R519 Generalized headaches: Secondary | ICD-10-CM

## 2022-10-04 DIAGNOSIS — M818 Other osteoporosis without current pathological fracture: Secondary | ICD-10-CM

## 2022-10-04 DIAGNOSIS — K769 Liver disease, unspecified: Secondary | ICD-10-CM

## 2022-10-04 DIAGNOSIS — K754 Autoimmune hepatitis: Secondary | ICD-10-CM

## 2022-10-04 DIAGNOSIS — A0472 Enterocolitis due to Clostridium difficile, not specified as recurrent: Secondary | ICD-10-CM

## 2022-10-04 DIAGNOSIS — M8589 Other specified disorders of bone density and structure, multiple sites: Secondary | ICD-10-CM

## 2022-10-04 DIAGNOSIS — C801 Malignant (primary) neoplasm, unspecified: Secondary | ICD-10-CM

## 2022-10-04 DIAGNOSIS — I8500 Esophageal varices without bleeding: Secondary | ICD-10-CM

## 2022-10-04 DIAGNOSIS — T148XXA Other injury of unspecified body region, initial encounter: Secondary | ICD-10-CM

## 2022-10-04 DIAGNOSIS — N200 Calculus of kidney: Secondary | ICD-10-CM

## 2022-10-04 DIAGNOSIS — IMO0002 Squamous cell carcinoma: Secondary | ICD-10-CM

## 2022-10-04 LAB — CBC AND DIFF
ABSOLUTE BASO COUNT: 0 10*3/uL (ref 0.0–0.10)
ABSOLUTE EOS COUNT: 0.3 10*3/uL (ref 0.0–0.5)
ABSOLUTE LYMPH COUNT: 3 10*3/uL (ref 0.90–3.00)
ABSOLUTE MONO COUNT: 1.4 10*3/uL — ABNORMAL HIGH (ref 0.33–0.71)
ABSOLUTE NEUTROPHIL: 4.4 10*3/uL (ref 1.39–8.00)
BASOPHILS: 0.2 % (ref 0.1–0.9)
EOSINOPHIL %: 3.4 % (ref 0.7–8.2)
HEMATOCRIT: 45 % (ref 32.70–47.30)
HEMOGLOBIN: 14 g/dL (ref 10.50–16.20)
MCH: 26 pg — ABNORMAL LOW (ref 27.10–34.00)
MCHC: 31 g/dL — ABNORMAL LOW (ref 31.70–35.10)
MCV: 85 fL (ref 82.10–98.20)
MONOCYTES %: 14 % — ABNORMAL HIGH (ref 4.4–12.4)
MPV: 10 fL (ref 7.2–12.4)
NEUTROPHILS %: 48 % (ref 38.2–80.6)
PLATELET COUNT: 269 10*3/uL (ref 150–393)
RBC COUNT: 5.3 x10E6/ul (ref 3.33–5.49)
RDW: 15 % (ref 11.5–15.7)
WBC COUNT: 9.1 10*3/uL (ref 3.64–10.7)

## 2022-10-04 LAB — COMPREHENSIVE METABOLIC PANEL
ALBUMIN: 4.3 g/dL
ALK PHOSPHATASE: 68
ALT: 25
ANION GAP: 11
AST: 29
BLD UREA NITROGEN: 18 mg/dL (ref 9–20)
CALCIUM: 9.2 mg/dL (ref 8.4–10.2)
CHLORIDE: 109 mmol/L — ABNORMAL HIGH (ref 98–107)
CO2: 23 mmol/L (ref 22–30)
CREATININE: 1 mg/dL (ref 0.66–1.25)
EGFR: >60 mL/min/{1.73_m2}
GLUCOSE,RANDOM: 103 mg/dL (ref 74.0–106.0)
POTASSIUM: 4 mmol/L (ref 3.5–5.1)
SODIUM: 139 mmol/L (ref 134–144)
TOTAL BILIRUBIN: 0.7 mg/dL
TOTAL PROTEIN: 7.4

## 2022-10-04 LAB — CALCIUM: CALCIUM: 9.1 mg/dL (ref 8.5–10.6)

## 2022-10-04 LAB — ALBUMIN: ALBUMIN: 4 g/dL (ref 3.5–5.0)

## 2022-10-04 LAB — TSH WITH FREE T4 REFLEX: TSH: 0.7 uU/mL (ref 0.35–5.00)

## 2022-10-04 LAB — PARATHYROID HORMONE: PTH HORMONE: 31 pg/mL (ref 10–65)

## 2022-10-04 LAB — PHOSPHORUS: PHOSPHORUS: 3.7

## 2022-10-04 LAB — MAGNESIUM: MAGNESIUM: 1.7 mg/dL (ref 1.6–2.3)
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
## 2022-10-11 ENCOUNTER — Encounter: Admit: 2022-10-11 | Discharge: 2022-10-11 | Payer: MEDICARE | Primary: Family

## 2022-10-11 NOTE — Telephone Encounter
Called Lab for results. Took verbal Tacrolimus 3.3, Everolimus 4.7. Requested results to be faxed as well.

## 2022-10-12 ENCOUNTER — Encounter: Admit: 2022-10-12 | Discharge: 2022-10-12 | Payer: MEDICARE | Primary: Family

## 2022-10-12 DIAGNOSIS — Z944 Liver transplant status: Secondary | ICD-10-CM

## 2022-10-12 LAB — TACROLIMUS LC-MS/MS: TACROLIMUS LC-MS/MS: 3.3 — ABNORMAL LOW (ref 5.0–20.0)

## 2022-10-12 LAB — EVEROLIMUS BLOOD: EVEROLIMUS: 4.7

## 2022-10-26 ENCOUNTER — Encounter: Admit: 2022-10-26 | Discharge: 2022-10-26 | Payer: MEDICARE | Primary: Family

## 2022-10-26 MED ORDER — PRAVASTATIN 10 MG PO TAB
10 mg | ORAL_TABLET | Freq: Every evening | ORAL | 1 refills | 90.00000 days | Status: AC
Start: 2022-10-26 — End: ?

## 2022-11-09 ENCOUNTER — Encounter: Admit: 2022-11-09 | Discharge: 2022-11-09 | Payer: MEDICARE | Primary: Family

## 2022-11-09 DIAGNOSIS — Z944 Liver transplant status: Secondary | ICD-10-CM

## 2022-11-09 LAB — COMPREHENSIVE METABOLIC PANEL
ANION GAP: 12
BLD UREA NITROGEN: 22 mg/dL — ABNORMAL HIGH (ref 9–20)
CHLORIDE: 111 mmol/L — ABNORMAL HIGH (ref 98–107)
CO2: 20 mmol/L — ABNORMAL LOW (ref 22–30)
CREATININE: 1 mg/dL (ref 0.66–1.25)
POTASSIUM: 3.9 mmol/L (ref 3.5–5.1)
SODIUM: 140 mmol/L (ref 134–144)

## 2022-11-09 LAB — CBC AND DIFF
ABSOLUTE BASO COUNT: 0 10*3/uL (ref 0.0–0.10)
ABSOLUTE EOS COUNT: 0.4 10*3/uL (ref 0.0–0.5)
ABSOLUTE LYMPH COUNT: 2.3 10*3/uL (ref 0.90–3.00)
ABSOLUTE MONO COUNT: 1.1 10*3/uL — ABNORMAL HIGH (ref 0.33–0.71)
ABSOLUTE NEUTROPHIL: 4.5 10*3/uL (ref 1.39–8.00)
HEMATOCRIT: 44 % (ref 32.70–47.30)
HEMOGLOBIN: 14 g/dL (ref 10.50–16.20)
MCH: 27 pg (ref 27.10–34.00)
MCHC: 31 g/dL — ABNORMAL LOW (ref 31.70–35.10)
MCV: 86 fL (ref 82.10–98.20)
MPV: 10 fL (ref 7.2–12.4)
NEUTROPHILS %: 54 % (ref 38.2–80.6)
PLATELET COUNT: 253 10*3/uL (ref 150–393)
RBC COUNT: 5.1 x10E6/ul — ABNORMAL HIGH (ref 3.33–5.49)
RDW: 15 % (ref 11.5–15.7)
WBC COUNT: 8.3 10*3/uL (ref 3.64–10.7)

## 2022-11-11 ENCOUNTER — Encounter: Admit: 2022-11-11 | Discharge: 2022-11-11 | Payer: MEDICARE | Primary: Family

## 2022-11-11 MED ORDER — OMEPRAZOLE 20 MG PO CPDR
20 mg | ORAL_CAPSULE | Freq: Every day | ORAL | 1 refills
Start: 2022-11-11 — End: ?

## 2022-11-13 ENCOUNTER — Encounter: Admit: 2022-11-13 | Discharge: 2022-11-13 | Payer: MEDICARE | Primary: Family

## 2022-11-22 ENCOUNTER — Encounter: Admit: 2022-11-22 | Discharge: 2022-11-22 | Payer: MEDICARE | Primary: Family

## 2022-12-17 ENCOUNTER — Encounter: Admit: 2022-12-17 | Discharge: 2022-12-17 | Payer: MEDICARE | Primary: Family

## 2022-12-28 ENCOUNTER — Encounter: Admit: 2022-12-28 | Discharge: 2022-12-28 | Payer: MEDICARE | Primary: Family

## 2023-01-04 ENCOUNTER — Encounter: Admit: 2023-01-04 | Discharge: 2023-01-04 | Payer: MEDICARE | Primary: Family

## 2023-01-07 ENCOUNTER — Encounter: Admit: 2023-01-07 | Discharge: 2023-01-07 | Payer: MEDICARE | Primary: Family

## 2023-01-15 ENCOUNTER — Encounter: Admit: 2023-01-15 | Discharge: 2023-01-15 | Payer: MEDICARE | Primary: Family

## 2023-01-15 NOTE — Telephone Encounter
Left message with staff at [facility] office requesting them to send recent everolimus level on patient from 12/19 draw. Requested to call this [facility] if they did not draw and gave fax number for results.

## 2023-01-19 ENCOUNTER — Encounter: Admit: 2023-01-19 | Discharge: 2023-01-19 | Payer: MEDICARE | Primary: Family

## 2023-01-28 ENCOUNTER — Encounter: Admit: 2023-01-28 | Discharge: 2023-01-28 | Payer: MEDICARE | Primary: Family

## 2023-01-28 MED ORDER — TACROLIMUS 1 MG PO CAP
3 mg | ORAL_CAPSULE | Freq: Two times a day (BID) | ORAL | 1 refills | 30.00000 days | Status: DC
Start: 2023-01-28 — End: 2023-01-28

## 2023-01-30 ENCOUNTER — Encounter: Admit: 2023-01-30 | Discharge: 2023-01-30 | Payer: MEDICARE | Primary: Family

## 2023-01-30 NOTE — Telephone Encounter
Patient had not read mychart message regarding medication change. Called and left message with patient.     01/30/2023    Patient notified of recent Tacrolimus trough level, 3.8 and Everolimus level 4.1.  Current Tacrolimus dose 3/3 goal 5, and Everolimus 0.5/0.5 goal to wean off due to coverage.  Advised patient increase dose to 4/4 and stop Everolimus, updated prescription sent to his pharmacy. Everolimus removed from med list.   Advised patient to repeat labs in 2 weeks, lab orders sent to PCP office.

## 2023-02-13 ENCOUNTER — Encounter: Admit: 2023-02-13 | Discharge: 2023-02-13 | Payer: MEDICARE | Primary: Family

## 2023-02-20 ENCOUNTER — Encounter: Admit: 2023-02-20 | Discharge: 2023-02-20 | Payer: MEDICARE | Primary: Family

## 2023-03-03 ENCOUNTER — Encounter: Admit: 2023-03-03 | Discharge: 2023-03-03 | Payer: MEDICARE | Primary: Family

## 2023-03-05 ENCOUNTER — Encounter: Admit: 2023-03-05 | Discharge: 2023-03-05 | Payer: MEDICARE | Primary: Family

## 2023-04-15 ENCOUNTER — Encounter: Admit: 2023-04-15 | Discharge: 2023-04-15 | Primary: Family

## 2023-04-17 ENCOUNTER — Encounter: Admit: 2023-04-17 | Discharge: 2023-04-17 | Primary: Family

## 2023-04-18 ENCOUNTER — Encounter: Admit: 2023-04-18 | Discharge: 2023-04-18 | Primary: Family

## 2023-04-22 ENCOUNTER — Encounter: Admit: 2023-04-22 | Discharge: 2023-04-22 | Primary: Family

## 2023-04-23 ENCOUNTER — Encounter: Admit: 2023-04-23 | Discharge: 2023-04-23 | Primary: Family

## 2023-04-24 ENCOUNTER — Encounter: Admit: 2023-04-24 | Discharge: 2023-04-24 | Primary: Family

## 2023-04-29 ENCOUNTER — Encounter: Admit: 2023-04-29 | Discharge: 2023-04-29 | Payer: MEDICARE | Primary: Family

## 2023-04-29 MED ORDER — PRAVASTATIN 10 MG PO TAB
10 mg | ORAL_TABLET | Freq: Every evening | ORAL | 3 refills | 90.00000 days | Status: AC
Start: 2023-04-29 — End: ?

## 2023-05-01 ENCOUNTER — Encounter: Admit: 2023-05-01 | Discharge: 2023-05-01 | Payer: MEDICARE | Primary: Family

## 2023-05-01 MED ORDER — OMEPRAZOLE 20 MG PO CPDR
20 mg | ORAL_CAPSULE | Freq: Every day | ORAL | 3 refills | 90.00000 days | Status: AC
Start: 2023-05-01 — End: ?

## 2023-05-01 MED ORDER — METOPROLOL TARTRATE 25 MG PO TAB
12.5 mg | ORAL_TABLET | Freq: Two times a day (BID) | ORAL | 3 refills | 90.00000 days | Status: AC
Start: 2023-05-01 — End: ?

## 2023-05-03 ENCOUNTER — Encounter: Admit: 2023-05-03 | Discharge: 2023-05-03 | Payer: MEDICARE | Primary: Family

## 2023-05-06 ENCOUNTER — Encounter: Admit: 2023-05-06 | Discharge: 2023-05-06 | Payer: MEDICARE | Primary: Family

## 2023-05-17 ENCOUNTER — Encounter: Admit: 2023-05-17 | Discharge: 2023-05-17 | Payer: MEDICARE | Primary: Family

## 2023-06-03 ENCOUNTER — Encounter: Admit: 2023-06-03 | Discharge: 2023-06-03 | Payer: MEDICARE | Primary: Family

## 2023-06-04 ENCOUNTER — Encounter: Admit: 2023-06-04 | Discharge: 2023-06-04 | Payer: MEDICARE | Primary: Family

## 2023-06-05 ENCOUNTER — Encounter: Admit: 2023-06-05 | Discharge: 2023-06-05 | Payer: MEDICARE | Primary: Family

## 2023-06-27 ENCOUNTER — Encounter: Admit: 2023-06-27 | Discharge: 2023-06-27 | Payer: MEDICARE | Primary: Family

## 2023-07-09 ENCOUNTER — Encounter: Admit: 2023-07-09 | Discharge: 2023-07-09 | Payer: MEDICARE | Primary: Family

## 2023-07-10 ENCOUNTER — Encounter: Admit: 2023-07-10 | Discharge: 2023-07-10 | Payer: MEDICARE | Primary: Family

## 2023-07-16 ENCOUNTER — Encounter: Admit: 2023-07-16 | Discharge: 2023-07-16 | Payer: MEDICARE | Primary: Family

## 2023-07-17 ENCOUNTER — Encounter: Admit: 2023-07-17 | Discharge: 2023-07-17 | Payer: MEDICARE | Primary: Family

## 2023-07-17 DIAGNOSIS — D849 Immunodeficiency, unspecified: Principal | ICD-10-CM

## 2023-07-17 DIAGNOSIS — Z944 Liver transplant status: Secondary | ICD-10-CM

## 2023-07-21 ENCOUNTER — Encounter: Admit: 2023-07-21 | Discharge: 2023-07-21 | Payer: MEDICARE | Primary: Family

## 2023-07-24 ENCOUNTER — Encounter: Admit: 2023-07-24 | Discharge: 2023-07-24 | Payer: MEDICARE | Primary: Family

## 2023-07-30 ENCOUNTER — Encounter: Admit: 2023-07-30 | Discharge: 2023-07-30 | Payer: MEDICARE | Primary: Family

## 2023-07-30 DIAGNOSIS — Z944 Liver transplant status: Principal | ICD-10-CM

## 2023-07-30 MED ORDER — PREDNISONE 5 MG PO TAB
5 mg | ORAL_TABLET | Freq: Every day | ORAL | 3 refills | 30.00000 days | Status: AC
Start: 2023-07-30 — End: ?

## 2023-08-02 ENCOUNTER — Encounter: Admit: 2023-08-02 | Discharge: 2023-08-02 | Payer: MEDICARE | Primary: Family

## 2023-08-08 ENCOUNTER — Encounter: Admit: 2023-08-08 | Discharge: 2023-08-08 | Payer: MEDICARE | Primary: Family

## 2023-08-08 ENCOUNTER — Ambulatory Visit: Admit: 2023-08-08 | Discharge: 2023-08-08 | Payer: MEDICARE | Primary: Family

## 2023-08-08 DIAGNOSIS — M8588 Other specified disorders of bone density and structure, other site: Principal | ICD-10-CM

## 2023-08-08 DIAGNOSIS — M8580 Other specified disorders of bone density and structure, unspecified site: Secondary | ICD-10-CM
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
## 2023-08-09 ENCOUNTER — Encounter: Admit: 2023-08-09 | Discharge: 2023-08-09 | Payer: MEDICARE | Primary: Family

## 2023-08-09 DIAGNOSIS — M818 Other osteoporosis without current pathological fracture: Principal | ICD-10-CM

## 2023-08-09 NOTE — Telephone Encounter
 Carmen from [facility] called. Pt is there.  The NTX telopeptide is not available as a serum lab, only urine.  Are you ok with urine?    Also, dx code for TSH reflex M85.88 and M85.80 do not work. Please advise on a new code.        Dr. Billy gave verbal approval of urine result and DX code of hypothyroid E03.9.  Urine test ordered.    Called [facility] but she was not available. Male took this info in a message to [facility].

## 2023-08-12 ENCOUNTER — Encounter: Admit: 2023-08-12 | Discharge: 2023-08-12 | Payer: MEDICARE | Primary: Family

## 2023-09-05 ENCOUNTER — Encounter: Admit: 2023-09-05 | Discharge: 2023-09-05 | Payer: MEDICARE | Primary: Family

## 2023-09-06 ENCOUNTER — Encounter: Admit: 2023-09-06 | Discharge: 2023-09-06 | Payer: MEDICARE | Primary: Family

## 2023-09-09 ENCOUNTER — Encounter: Admit: 2023-09-09 | Discharge: 2023-09-09 | Payer: MEDICARE | Primary: Family

## 2023-09-10 ENCOUNTER — Encounter: Admit: 2023-09-10 | Discharge: 2023-09-10 | Payer: MEDICARE | Primary: Family

## 2023-09-10 DIAGNOSIS — M818 Other osteoporosis without current pathological fracture: Principal | ICD-10-CM

## 2023-09-23 ENCOUNTER — Encounter: Admit: 2023-09-23 | Discharge: 2023-09-23 | Payer: MEDICARE | Primary: Family

## 2023-09-23 NOTE — Telephone Encounter
 LVM requesting call back re: EGD appt date. Direct line to this nurse left.

## 2023-09-30 ENCOUNTER — Encounter: Admit: 2023-09-30 | Discharge: 2023-09-30 | Payer: MEDICARE | Primary: Family

## 2023-10-03 ENCOUNTER — Encounter: Admit: 2023-10-03 | Discharge: 2023-10-03 | Payer: MEDICARE | Primary: Family

## 2023-10-11 ENCOUNTER — Encounter: Admit: 2023-10-11 | Discharge: 2023-10-11 | Payer: MEDICARE | Primary: Family

## 2023-10-14 ENCOUNTER — Encounter: Admit: 2023-10-14 | Discharge: 2023-10-14 | Payer: MEDICARE | Primary: Family

## 2023-12-04 ENCOUNTER — Encounter: Admit: 2023-12-04 | Discharge: 2023-12-04 | Payer: MEDICARE | Primary: Family

## 2023-12-06 ENCOUNTER — Encounter: Admit: 2023-12-06 | Discharge: 2023-12-06 | Payer: MEDICARE | Primary: Family

## 2023-12-11 ENCOUNTER — Encounter: Admit: 2023-12-11 | Discharge: 2023-12-11 | Payer: MEDICARE | Primary: Family

## 2023-12-20 ENCOUNTER — Encounter: Admit: 2023-12-20 | Discharge: 2023-12-20 | Payer: MEDICARE | Primary: Family

## 2023-12-20 NOTE — Telephone Encounter [36]
 Called [facility] requesting tacrolimus  level from 11/20 labs. They will fax result now.

## 2023-12-21 ENCOUNTER — Encounter: Admit: 2023-12-21 | Discharge: 2023-12-21 | Payer: MEDICARE | Primary: Family

## 2023-12-24 ENCOUNTER — Encounter: Admit: 2023-12-24 | Discharge: 2023-12-24 | Payer: MEDICARE | Primary: Family

## 2023-12-25 ENCOUNTER — Encounter: Admit: 2023-12-25 | Discharge: 2023-12-25 | Payer: MEDICARE | Primary: Family

## 2024-01-24 ENCOUNTER — Encounter: Admit: 2024-01-24 | Discharge: 2024-01-24 | Payer: MEDICARE | Primary: Family

## 2024-01-24 DIAGNOSIS — Z944 Liver transplant status: Secondary | ICD-10-CM

## 2024-01-24 DIAGNOSIS — K754 Autoimmune hepatitis: Secondary | ICD-10-CM

## 2024-01-24 DIAGNOSIS — D849 Immunodeficiency, unspecified: Secondary | ICD-10-CM

## 2024-01-24 MED ORDER — TACROLIMUS 1 MG PO CAP
4 mg | ORAL_CAPSULE | Freq: Two times a day (BID) | ORAL | 3 refills | 30.00000 days | Status: AC
Start: 2024-01-24 — End: ?
